# Patient Record
Sex: Male | Born: 1965 | Race: Black or African American | Hispanic: No | Marital: Single | Smoking: Current every day smoker
Health system: Southern US, Community
[De-identification: ages and names within clinical notes are randomized; demographics above are authoritative.]

## PROBLEM LIST (undated history)

## (undated) DIAGNOSIS — I10 Essential (primary) hypertension: Secondary | ICD-10-CM

---

## 2008-09-19 ENCOUNTER — Emergency Department (HOSPITAL_COMMUNITY): Admission: EM | Admit: 2008-09-19 | Discharge: 2008-09-20 | Payer: Self-pay | Admitting: Emergency Medicine

## 2008-09-20 ENCOUNTER — Emergency Department (HOSPITAL_COMMUNITY): Admission: EM | Admit: 2008-09-20 | Discharge: 2008-09-20 | Payer: Self-pay | Admitting: Emergency Medicine

## 2009-01-17 ENCOUNTER — Emergency Department (HOSPITAL_COMMUNITY): Admission: EM | Admit: 2009-01-17 | Discharge: 2009-01-17 | Payer: Self-pay | Admitting: Emergency Medicine

## 2009-03-03 ENCOUNTER — Emergency Department (HOSPITAL_COMMUNITY): Admission: EM | Admit: 2009-03-03 | Discharge: 2009-03-03 | Payer: Self-pay | Admitting: Emergency Medicine

## 2009-04-03 ENCOUNTER — Emergency Department (HOSPITAL_COMMUNITY): Admission: EM | Admit: 2009-04-03 | Discharge: 2009-04-03 | Payer: Self-pay | Admitting: Emergency Medicine

## 2009-06-15 ENCOUNTER — Emergency Department (HOSPITAL_COMMUNITY): Admission: EM | Admit: 2009-06-15 | Discharge: 2009-06-15 | Payer: Self-pay | Admitting: Emergency Medicine

## 2009-07-10 ENCOUNTER — Emergency Department (HOSPITAL_COMMUNITY): Admission: EM | Admit: 2009-07-10 | Discharge: 2009-07-10 | Payer: Self-pay | Admitting: Emergency Medicine

## 2010-07-12 LAB — CBC
MCHC: 34.5 g/dL (ref 30.0–36.0)
RBC: 5.25 MIL/uL (ref 4.22–5.81)
WBC: 6.2 10*3/uL (ref 4.0–10.5)

## 2010-07-12 LAB — RAPID URINE DRUG SCREEN, HOSP PERFORMED
Cocaine: POSITIVE — AB
Opiates: NOT DETECTED
Tetrahydrocannabinol: NOT DETECTED

## 2010-07-12 LAB — DIFFERENTIAL
Basophils Relative: 0 % (ref 0–1)
Lymphocytes Relative: 26 % (ref 12–46)
Monocytes Relative: 4 % (ref 3–12)
Neutro Abs: 4.2 10*3/uL (ref 1.7–7.7)
Neutrophils Relative %: 67 % (ref 43–77)

## 2010-07-12 LAB — ETHANOL: Alcohol, Ethyl (B): 11 mg/dL — ABNORMAL HIGH (ref 0–10)

## 2010-07-12 LAB — BASIC METABOLIC PANEL
CO2: 25 mEq/L (ref 19–32)
Calcium: 8.9 mg/dL (ref 8.4–10.5)
Creatinine, Ser: 0.87 mg/dL (ref 0.4–1.5)
GFR calc Af Amer: >60 mL/min (ref >60)
GFR calc non Af Amer: >60 mL/min (ref >60)

## 2010-07-17 LAB — URINALYSIS, ROUTINE W REFLEX MICROSCOPIC
Bilirubin Urine: NEGATIVE
Glucose, UA: NEGATIVE mg/dL
Ketones, ur: NEGATIVE mg/dL
Nitrite: NEGATIVE
pH: 6 (ref 5.0–8.0)

## 2010-08-01 LAB — URINALYSIS, ROUTINE W REFLEX MICROSCOPIC
Bilirubin Urine: NEGATIVE
Nitrite: NEGATIVE
Specific Gravity, Urine: 1.027 (ref 1.005–1.030)
Urobilinogen, UA: 0.2 mg/dL (ref 0.0–1.0)

## 2010-08-01 LAB — POCT I-STAT, CHEM 8
Chloride: 101 mEq/L (ref 96–112)
HCT: 54 % — ABNORMAL HIGH (ref 39.0–52.0)
Potassium: 3.2 mEq/L — ABNORMAL LOW (ref 3.5–5.1)
Sodium: 139 mEq/L (ref 135–145)

## 2010-08-01 LAB — PROTIME-INR
INR: 1 (ref 0.00–1.49)
Prothrombin Time: 13.1 seconds (ref 11.6–15.2)

## 2010-08-01 LAB — TYPE AND SCREEN

## 2010-08-01 LAB — CBC
Hemoglobin: 17.1 g/dL — ABNORMAL HIGH (ref 13.0–17.0)
RBC: 5.81 MIL/uL (ref 4.22–5.81)

## 2010-08-01 LAB — ABO/RH: ABO/RH(D): B NEG

## 2010-11-14 IMAGING — CR DG FOOT 2V*L*
2 series · 2 of 2 positions shown · non-contrast
Comparison: None

CLINICAL DATA: Left foot pain

LEFT FOOT - 2 VIEW

[view not recorded (1 of 2)]
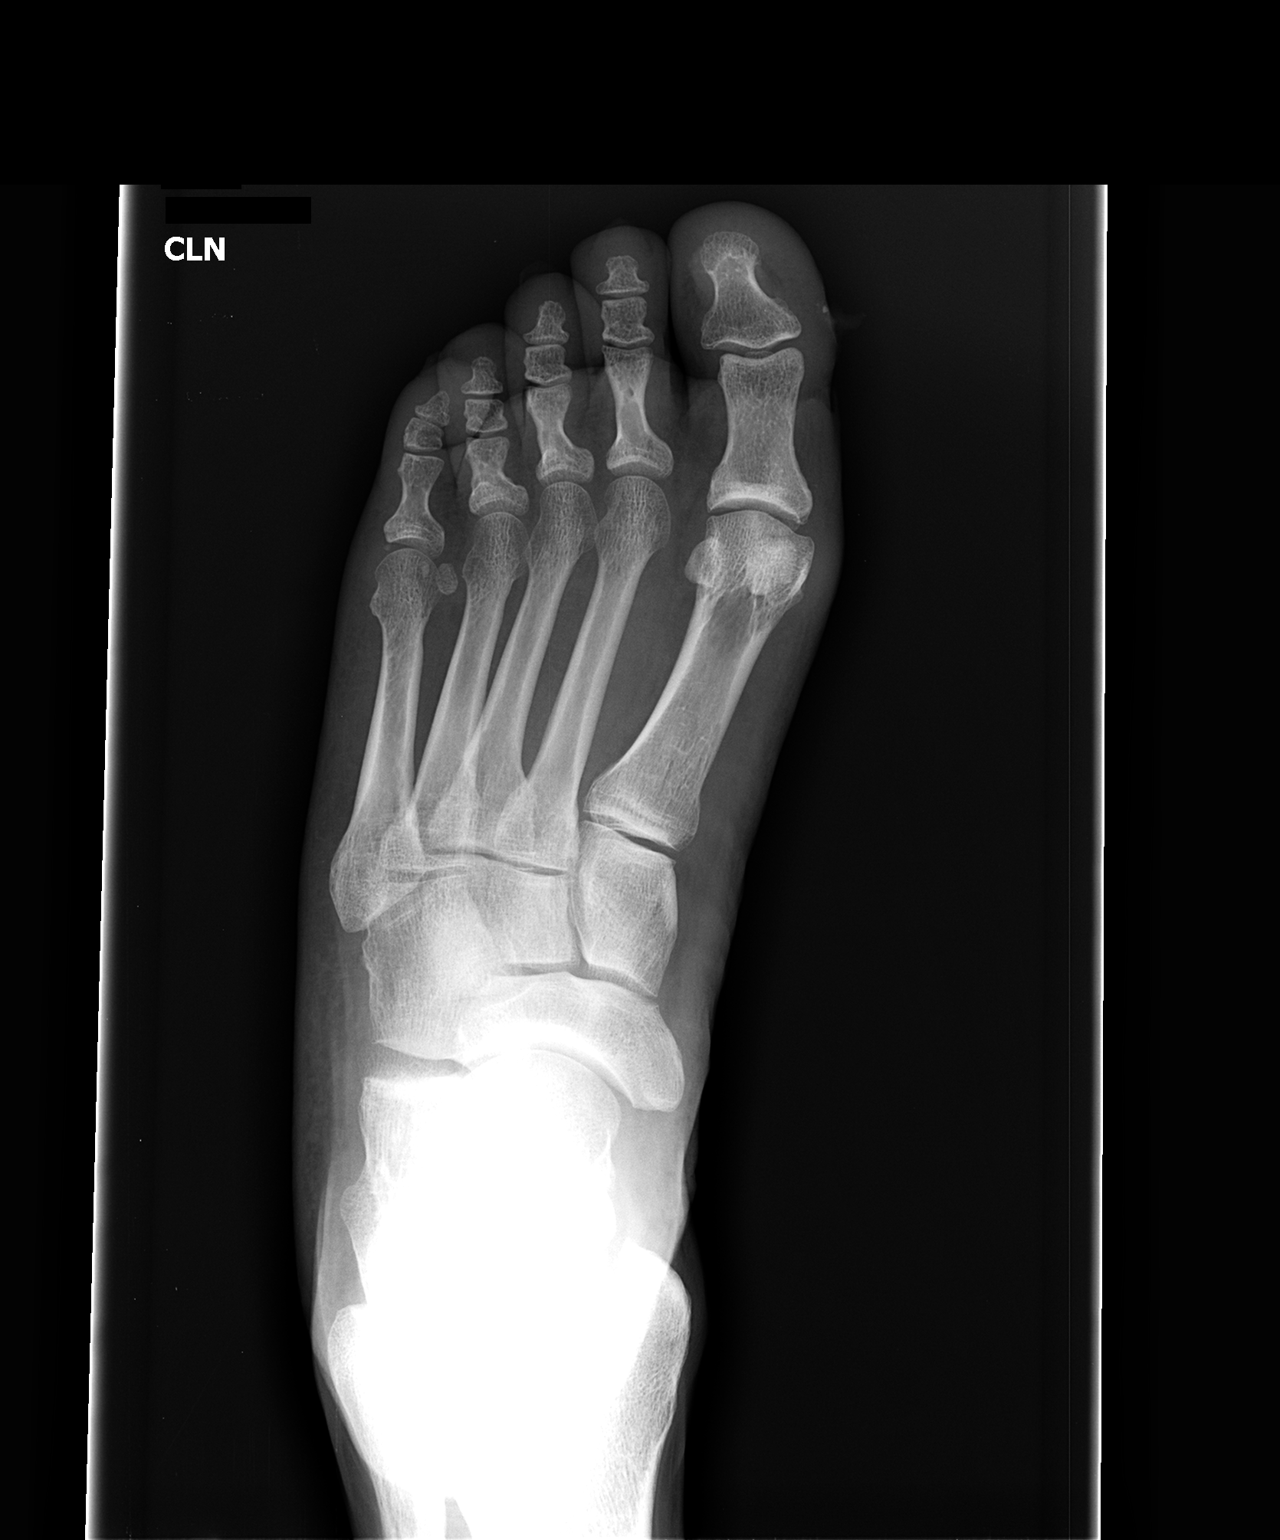

[view not recorded (2 of 2)]
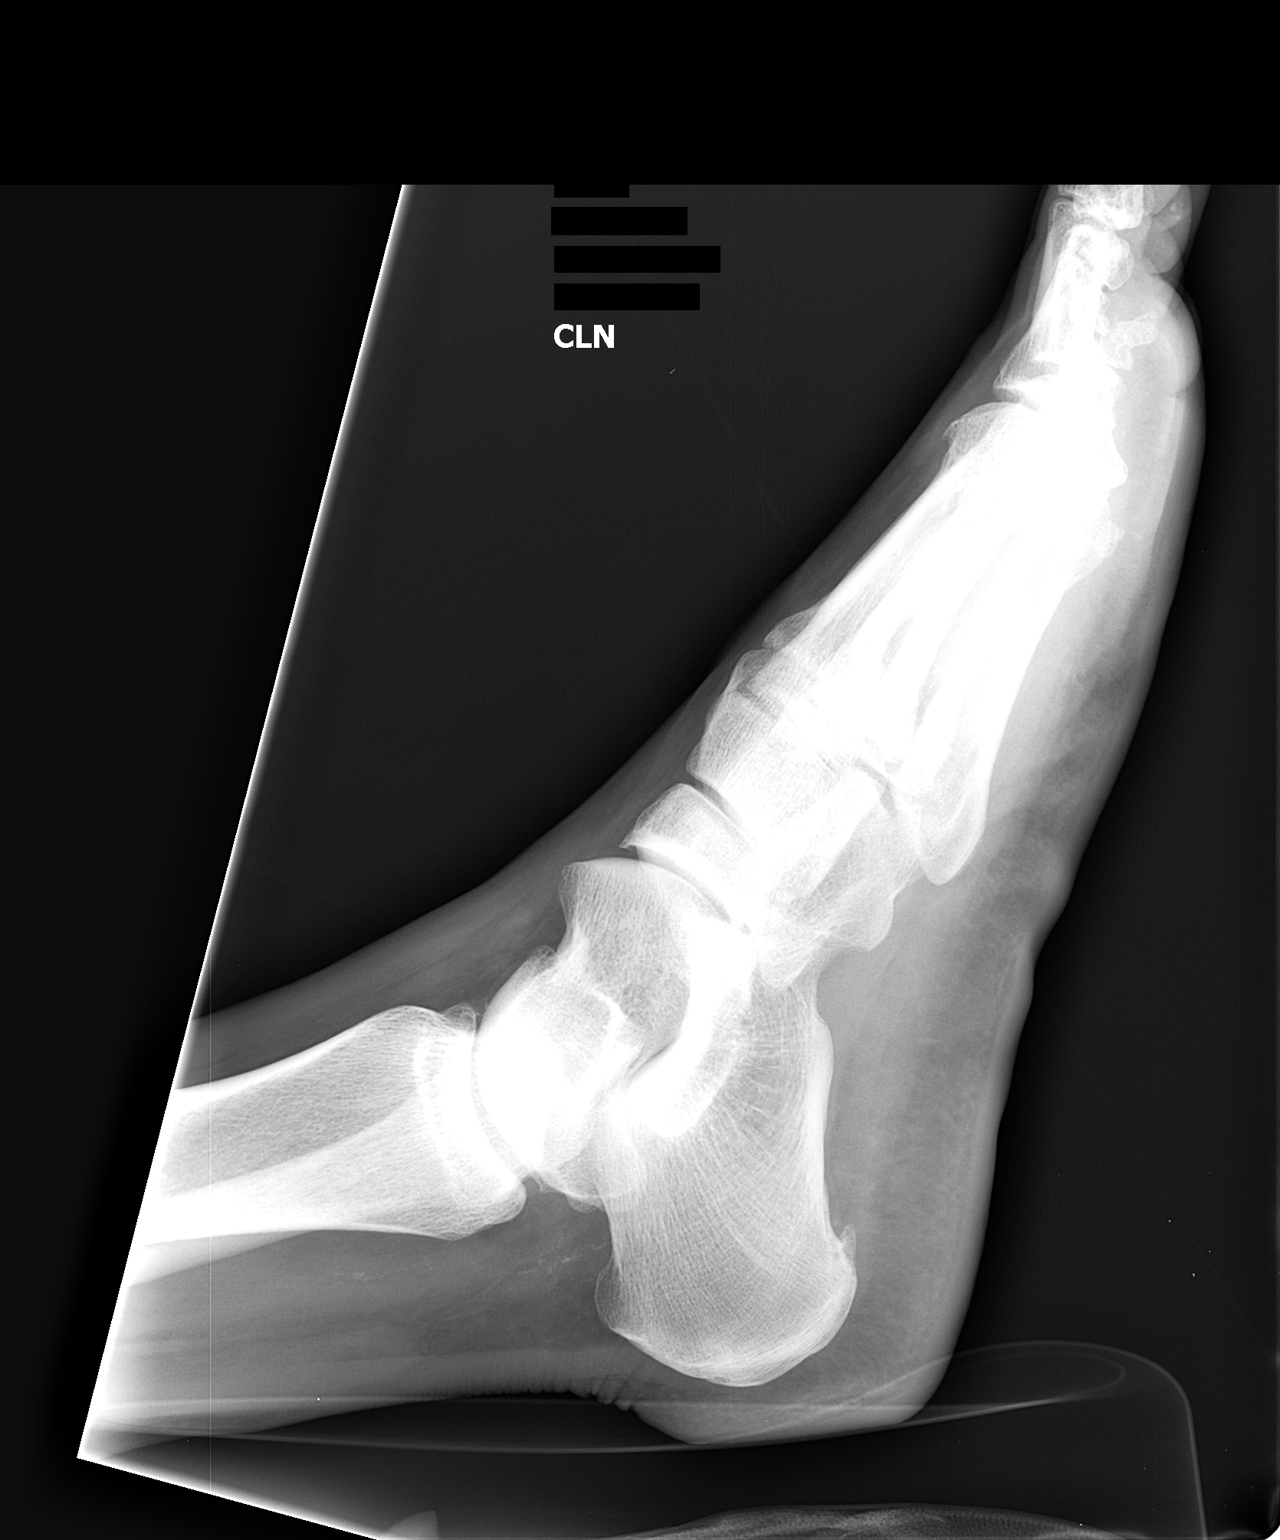

[2 of 2 positions shown; findings below may reference images not displayed]

FINDINGS: Degenerative changes of the first metatarsal phalangeal
articulation.  No fracture.  Soft tissues unremarkable.
IMPRESSION: Negative for fracture

## 2011-10-07 ENCOUNTER — Emergency Department: Payer: Self-pay | Admitting: Emergency Medicine

## 2011-10-12 ENCOUNTER — Emergency Department: Payer: Self-pay | Admitting: Emergency Medicine

## 2011-10-12 LAB — CBC
HGB: 16.1 g/dL (ref 13.0–18.0)
MCH: 29.7 pg (ref 26.0–34.0)
MCV: 88 fL (ref 80–100)
Platelet: 214 10*3/uL (ref 150–440)

## 2011-10-12 LAB — BASIC METABOLIC PANEL
Anion Gap: 7 (ref 7–16)
Calcium, Total: 8.7 mg/dL (ref 8.5–10.1)
Co2: 28 mmol/L (ref 21–32)
EGFR (African American): >60
EGFR (Non-African Amer.): 56 — ABNORMAL LOW
Glucose: 96 mg/dL (ref 65–99)
Sodium: 142 mmol/L (ref 136–145)

## 2011-11-04 ENCOUNTER — Emergency Department: Payer: Self-pay | Admitting: Emergency Medicine

## 2011-11-12 ENCOUNTER — Emergency Department: Payer: Self-pay | Admitting: Emergency Medicine

## 2011-11-12 LAB — CBC
HCT: 48.9 % (ref 40.0–52.0)
MCHC: 33.9 g/dL (ref 32.0–36.0)
MCV: 86 fL (ref 80–100)
Platelet: 233 10*3/uL (ref 150–440)
RBC: 5.66 10*6/uL (ref 4.40–5.90)
RDW: 14.2 % (ref 11.5–14.5)
WBC: 7.7 10*3/uL (ref 3.8–10.6)

## 2011-11-12 LAB — COMPREHENSIVE METABOLIC PANEL
Albumin: 3.5 g/dL (ref 3.4–5.0)
Anion Gap: 12 (ref 7–16)
BUN: 8 mg/dL (ref 7–18)
Bilirubin,Total: 0.8 mg/dL (ref 0.2–1.0)
Chloride: 102 mmol/L (ref 98–107)
Co2: 28 mmol/L (ref 21–32)
Creatinine: 0.98 mg/dL (ref 0.60–1.30)
EGFR (African American): >60
Glucose: 118 mg/dL — ABNORMAL HIGH (ref 65–99)
Osmolality: 283 (ref 275–301)
Potassium: 3.3 mmol/L — ABNORMAL LOW (ref 3.5–5.1)
SGOT(AST): 18 U/L (ref 15–37)
Sodium: 142 mmol/L (ref 136–145)
Total Protein: 6.9 g/dL (ref 6.4–8.2)

## 2011-11-12 LAB — URINALYSIS, COMPLETE
Bilirubin,UR: NEGATIVE
Glucose,UR: NEGATIVE mg/dL (ref 0–75)
Ketone: NEGATIVE
Leukocyte Esterase: NEGATIVE
Ph: 6 (ref 4.5–8.0)
RBC,UR: <1 /HPF (ref 0–5)
Specific Gravity: 1.015 (ref 1.003–1.030)
Squamous Epithelial: 1
WBC UR: 1 /HPF (ref 0–5)

## 2011-11-14 ENCOUNTER — Emergency Department: Payer: Self-pay | Admitting: Emergency Medicine

## 2011-11-14 LAB — URINALYSIS, COMPLETE
Bacteria: NONE SEEN
Bilirubin,UR: NEGATIVE
Blood: NEGATIVE
Nitrite: NEGATIVE
Ph: 7 (ref 4.5–8.0)
Protein: 30
RBC,UR: 1 /HPF (ref 0–5)
Specific Gravity: 1.028 (ref 1.003–1.030)
Squamous Epithelial: NONE SEEN

## 2011-11-14 LAB — CBC
HCT: 49 % (ref 40.0–52.0)
HGB: 16.2 g/dL (ref 13.0–18.0)
MCH: 29.1 pg (ref 26.0–34.0)
MCHC: 33.1 g/dL (ref 32.0–36.0)
MCV: 88 fL (ref 80–100)

## 2011-11-14 LAB — COMPREHENSIVE METABOLIC PANEL
Albumin: 3.8 g/dL (ref 3.4–5.0)
Alkaline Phosphatase: 106 U/L (ref 50–136)
BUN: 9 mg/dL (ref 7–18)
Bilirubin,Total: 0.6 mg/dL (ref 0.2–1.0)
Calcium, Total: 9.1 mg/dL (ref 8.5–10.1)
Osmolality: 280 (ref 275–301)
SGOT(AST): 28 U/L (ref 15–37)

## 2011-11-14 LAB — TROPONIN I: Troponin-I: 0.02 ng/mL

## 2011-11-14 LAB — LIPASE, BLOOD: Lipase: 109 U/L (ref 73–393)

## 2014-08-08 ENCOUNTER — Emergency Department (HOSPITAL_COMMUNITY): Payer: Self-pay

## 2014-08-08 ENCOUNTER — Emergency Department (HOSPITAL_COMMUNITY)
Admission: EM | Admit: 2014-08-08 | Discharge: 2014-08-09 | Disposition: A | Discharge status: Home / Self Care | Payer: Self-pay | Attending: Emergency Medicine | Admitting: Emergency Medicine

## 2014-08-08 ENCOUNTER — Encounter (HOSPITAL_COMMUNITY): Payer: Self-pay | Admitting: Emergency Medicine

## 2014-08-08 DIAGNOSIS — Z720 Tobacco use: Secondary | ICD-10-CM | POA: Insufficient documentation

## 2014-08-08 DIAGNOSIS — F1010 Alcohol abuse, uncomplicated: Secondary | ICD-10-CM | POA: Insufficient documentation

## 2014-08-08 DIAGNOSIS — M1711 Unilateral primary osteoarthritis, right knee: Secondary | ICD-10-CM | POA: Insufficient documentation

## 2014-08-08 DIAGNOSIS — M25461 Effusion, right knee: Secondary | ICD-10-CM | POA: Insufficient documentation

## 2014-08-08 DIAGNOSIS — M25571 Pain in right ankle and joints of right foot: Secondary | ICD-10-CM | POA: Insufficient documentation

## 2014-08-08 MED ORDER — CHLORDIAZEPOXIDE HCL 25 MG PO CAPS: ORAL_CAPSULE | ORAL | Status: DC

## 2014-08-08 MED ORDER — NAPROXEN 500 MG PO TABS: 500.0000 mg | ORAL_TABLET | Freq: Two times a day (BID) | ORAL | Status: DC

## 2014-08-08 MED ORDER — NAPROXEN 250 MG PO TABS
500.0000 mg | ORAL_TABLET | Freq: Once | ORAL | Status: AC
  Administered 2014-08-08: 500 mg via ORAL
  Filled 2014-08-08: qty 2

## 2014-08-08 MED ORDER — CHLORDIAZEPOXIDE HCL 5 MG PO CAPS
10.0000 mg | ORAL_CAPSULE | Freq: Once | ORAL | Status: AC
  Administered 2014-08-08: 10 mg via ORAL
  Filled 2014-08-08: qty 2

## 2014-08-08 NOTE — ED Provider Notes (Signed)
CSN: [id number]     Arrival date & time 08/08/14  2045 History   First MD Initiated Contact with Patient 08/08/14 2108     Chief Complaint  Patient presents with  . Leg Pain  . Alcohol Problem    HPI Patient presents to the emergency room with complaints of pain in his right knee and right ankle for the last 2 days. Have a history of a prior injuries knee but that was many years ago. Has not had any recent falls or injuries but has noticed some swelling and discomfort when he walks in both the ankle and the knee. Patient denies any fevers or chills. No chest pain or shortness of breath.  Patient also states that he is an alcoholic and abuses cocaine. He drinks a large amount every day. He was not able to quantify. He decided that he wanted to stop drinking. He last drank any alcohol this morning. He denies any prior history of having any withdrawal symptoms. No C History reviewed. No pertinent past medical history. History reviewed. No pertinent past surgical history. No family history on file. History  Substance Use Topics  . Smoking status: Current Every Day Smoker  . Smokeless tobacco: Not on file  . Alcohol Use: Yes    Review of Systems  All other systems reviewed and are negative.     Allergies  Review of patient's allergies indicates not on file.  Home Medications   Prior to Admission medications   Medication Sig Start Date End Date Taking? Authorizing Provider  chlordiazePOXIDE (LIBRIUM) 25 MG capsule  PO TID x 1D, then 25-50mg  PO BID X 1D, then 25-50mg  PO QD X 1D 08/08/14   Linwood Dibbles, MD  naproxen (NAPROSYN) 500 MG tablet Take 1 tablet (500 mg total) by mouth 2 (two) times daily with a meal. As needed for pain 08/08/14   Linwood Dibbles, MD   BP 137/97 mmHg  Pulse 84  Temp(Src) 98.5 F (36.9 C) (Oral)  Resp 18  SpO2 98% Physical Exam  Constitutional: He appears well-developed and well-nourished. No distress.  HENT:  Head: Normocephalic and atraumatic.  Right Ear:  External ear normal.  Left Ear: External ear normal.  Eyes: Conjunctivae are normal. Right eye exhibits no discharge. Left eye exhibits no discharge. No scleral icterus.  Neck: Neck supple. No tracheal deviation present.  Cardiovascular: Normal rate.   Pulmonary/Chest: Effort normal. No stridor. No respiratory distress.  Musculoskeletal: He exhibits tenderness. He exhibits no edema.       Right knee: He exhibits effusion. He exhibits normal range of motion, no deformity, no laceration and no erythema. No tenderness found.       Right ankle: He exhibits no swelling, no deformity and no laceration. Tenderness.  Neurological: He is alert. Cranial nerve deficit: no gross deficits.  Skin: Skin is warm and dry. No rash noted.  Psychiatric: He has a normal mood and affect.  Nursing note and vitals reviewed.   ED Course  Procedures (including critical care time) Labs Review Labs Reviewed - No data to display  Imaging Review Dg Ankle Complete Right  08/08/2014   CLINICAL DATA:  Right ankle pain  EXAM: RIGHT ANKLE - COMPLETE 3+ VIEW  COMPARISON:  None.  FINDINGS: Ankle mortise intact. No displaced fracture or dislocation. No aggressive osseous lesion. Plantar calcaneal enthesophyte. Atherosclerotic vascular calcifications  IMPRESSION: No acute or aggressive osseous finding of the right ankle.  Plantar calcaneal enthesophyte.   Electronically Signed   By: Jearld Lesch  M.D.   On: 08/08/2014 22:10   Dg Knee Complete 4 Views Right  08/08/2014   CLINICAL DATA:  Right knee pain, soft-tissue swelling on medial side with no known injury  EXAM: RIGHT KNEE - COMPLETE 4+ VIEW  COMPARISON:  None.  FINDINGS: Moderately severe narrowing and severe osteophyte formation of the medial and lateral compartments. No fracture or dislocation. There are loose bodies in the joint. There is severe osteophyte formation in the patellofemoral compartment. There is a large joint effusion.  IMPRESSION: Severe arthritic change  with large joint effusion.   Electronically Signed   By: Esperanza Heiraymond  Rubner M.D.   On: 08/08/2014 22:07      MDM   Final diagnoses:  Osteoarthritis of right knee, unspecified osteoarthritis type  Alcohol abuse    No sign of infection.  Severe oa has caused a joint effusion.  Pt has full rom.  No indication for arthrocentesis at this time.  Rest, crutches, knee sleeve.  Folllow up with ortho or pcp  Pt is an alcoholic.  No signs of withdrawal.  Will provide outpatient resources.  Rx librium to help with withdrawal sx developing    Linwood DibblesJon Tawana Pasch, MD 08/08/14 2250

## 2014-08-08 NOTE — ED Notes (Signed)
MD at bedside. 

## 2014-08-08 NOTE — ED Notes (Signed)
Doctor at bedside.

## 2014-08-08 NOTE — ED Notes (Addendum)
C/o R lower leg pain and swelling x 2 days.  States pain starts at knee and radiates down leg.  Denies injury.  Pt also requesting resources for etoh and cocaine detox.

## 2014-08-08 NOTE — Discharge Instructions (Signed)
Alcohol Withdrawal Alcohol withdrawal happens when you normally drink alcohol a lot and suddenly stop drinking. Alcohol withdrawal symptoms can be mild to very bad. Mild withdrawal symptoms can include feeling sick to your stomach (nauseous), headache, or feeling irritable. Bad withdrawal symptoms can include shakiness, being very nervous (anxious), and not thinking clearly.  HOME CARE  Join an alcohol support group.  Stay away from people or situations that make you want to drink.  Eat a healthy diet. Eat a lot of fresh fruits, vegetables, and lean meats. GET HELP RIGHT AWAY IF:   You become confused. You start to see and hear things that are not really there.  You feel your heart beating very fast.  You throw up (vomit) blood or cannot stop throwing up. This may be bright red or look like black coffee grounds.  You have blood in your poop (stool). This may be bright red, maroon colored, or black and tarry.  You are lightheaded or pass out (faint).  You develop a fever. MAKE SURE YOU:   Understand these instructions.  Will watch your condition. Will get help right away if you are not doing well or get worse.Substance Abuse Treatment Programs  Intensive Outpatient Programs [facility]     [address]      [place], [place]                   [phone]       [facility] [address] [place], [place] [phone]  [facility]     (Inpatient and outpatient)     [address]           [phone]    [facility] [phone] (Suboxone and Methadone)  [address]      [place], [place] [address]      [phone]       [address] [place], [place] [phone]  [facility] (Outpatient/Inpatient, Chemical)    (insurance only) [phone]             [facility] (Groups & Residential) [place], [place] [phone]     [facility]     [address]     [place], [place]      [phone]       [facility] (for caregivers and family) [phone] [place], [place] [phone]      Residential Treatment Programs [facility]      [address], [place], [place] [address]  [phone]       [facility] [address] [place], [place] [address] [phone]  [facility]        [phone]       [facility] [phone]  [facility]             [address]                                         [place], [place]                                                [phone] or [phone]  [facility] [address] [place], [address] [phone]  [facility]    [address]      [place], [place]     [phone]       [facility] [address] [place], [place] [phone]  [facility]   [address] [place], [place] [address]     [phone]      Admissions: 8am-3pm M-F  [facility] ([facility]) [address] [place], [place] [phone]  [facility]: Residential Program [phone] Days)   [place], [place]      [phone] or [phone]     [facility]: [facility] [place], [place] (Walk in Hours over the weekend or by referral)  [facility] [address] [place], [place], [place] [address] [phone]  Crisis Mobile: [facility]:  [phone] (for crisis response 24 hours a day) [facility] Hotline:      9841680271 Outpatient Psychiatry and Counseling  [facility]: Mobile Crisis Management 24 hours:  [phone]  [facility] [employer] sliding scale fee and walk in schedule: M-F 8am-12pm/1pm-3pm [address]  [place], [place] [address] [phone]  [facility] [address] [place], [place]  [address] [phone]  [facility] ([facility] [employer])- new patient walk-in appointments available Monday - Friday 8am -3pm.          [address] [place], [place] [address] [phone] or crisis line- [phone]  [facility] Out[facility] Services/ Intensive Outpatient Therapy Program [address] [place], [place] [address] [phone]  [facility]                  Crisis Services      [phone] [address]     [place], [place] [address]                 [facility]   [facility] [phone]. [address] [place], [place] [address]   [facility] of Care          [address]  [place], [place] [address]       [phone]  [facility] [address] [place], [place] [address] [phone]  [facility]    [address]    [place], [place] [address]     [phone]       Andee Poles, MD     [address]     [place] [address]     [phone]       [facility] [address] [place] [address] [place] [address]  [facility]     [address] [place], [place]      [phone]       [facility] Eulogio Ditch, MD [address] [place], [place] [address] [phone]  [facility]     [address]     [place], [place] [address]     [phone]       [facility] [address] [place], [place] [address] [phone] Resources for Temporary Residential Assistance/Crisis Centers  [facility]) M-F 8am-3pm   [address] [place], [place] [address]   [phone] Services include: laundry, barbering, support groups, case management, phone  &  computer access, showers, AA/NA mtgs, mental health/substance abuse nurse, job skills class,  disability information, VA assistance, spiritual classes, etc.   HOMELESS SHELTERS  [facility]     [facility]   [address] [place]     [phone]              [address] (women and children)       [address] [place], [place] [address] [phone] [email or web address] for application and process Application Required  [facility]   [address]    [place] [address]     [phone]                    [facility] [address] [place], [place] [address] [phone] application appt.) Application Required  [facility] (women only)    [address]     [place], [place] [address]     [phone]      Intake starts 6pm daily Need valid ID, SSC, & Police report [employer] [address] [place], [place] [phone] Application Required  [employer] (men only)     [address]      [place], [place]     [phone]       [facility] [place] (Pregnant women only) [address] [place], [place] [phone]  [facility]      [address]      [place], [place] [address]     [phone]             [facility] [address] [place], [place] [phone] 90 day commitment/SA/Application process  [facility](men only)     [address]     [place], [place]     [phone]       Check-in at [facility] [address] [place], [place] [address] [phone] Men/Women/Women and Children must be there by 7 pm  [facility] [place], [place] [phone]                Arthritis, Nonspecific Arthritis is inflammation of a joint. This usually means pain, redness, warmth or swelling are present. One or more joints may be involved. There are a number of types of arthritis. Your caregiver may not be able to  tell what type of arthritis you have right away. CAUSES  The most common cause of arthritis is the wear and tear on the joint (osteoarthritis). This causes damage to the cartilage, which can break down over time. The knees, hips, back and neck are most often affected by this type of arthritis. Other types of arthritis and common causes of joint pain include:  Sprains and other injuries near the joint. Sometimes minor sprains and injuries cause pain and swelling that develop hours later.  Rheumatoid arthritis. This affects hands, feet and knees. It usually affects both sides of your body at the same time. It is often associated with chronic ailments, fever, weight loss and general weakness.  Crystal arthritis. Gout and pseudo gout can cause occasional acute severe pain, redness and swelling in the foot, ankle, or knee.  Infectious arthritis. Bacteria can get into a joint through a break in overlying skin. This can cause infection of the joint. Bacteria and viruses can  also spread through the blood and affect your joints.  Drug, infectious and allergy reactions. Sometimes joints can become mildly painful and slightly swollen with these types of illnesses. SYMPTOMS   Pain is the main symptom.  Your joint or joints can also be red, swollen and warm or hot to the touch.  You may have a fever with certain types of arthritis, or even feel overall ill.  The joint with arthritis will hurt with movement. Stiffness is present with some types of arthritis. DIAGNOSIS  Your caregiver will suspect arthritis based on your description of your symptoms and on your exam. Testing may be needed to find the type of arthritis:  Blood and sometimes urine tests.  X-ray tests and sometimes CT or MRI scans.  Removal of fluid from the joint (arthrocentesis) is done to check for bacteria, crystals or other causes. Your caregiver (or a specialist) will numb the area over the joint with a local anesthetic, and use a  needle to remove joint fluid for examination. This procedure is only minimally uncomfortable.  Even with these tests, your caregiver may not be able to tell what kind of arthritis you have. Consultation with a specialist (rheumatologist) may be helpful. TREATMENT  Your caregiver will discuss with you treatment specific to your type of arthritis. If the specific type cannot be determined, then the following general recommendations may apply. Treatment of severe joint pain includes:  Rest.  Elevation.  Anti-inflammatory medication (for example, ibuprofen) may be prescribed. Avoiding activities that cause increased pain.  Only take over-the-counter or prescription medicines for pain and discomfort as recommended by your caregiver.  Cold packs over an inflamed joint may be used for 10 to 15 minutes every hour. Hot packs sometimes feel better, but do not use overnight. Do not use hot packs if you are diabetic without your caregiver's permission.  A cortisone shot into arthritic joints may help reduce pain and swelling.  Any acute arthritis that gets worse over the next 1 to 2 days needs to be looked at to be sure there is no joint infection. Long-term arthritis treatment involves modifying activities and lifestyle to reduce joint stress jarring. This can include weight loss. Also, exercise is needed to nourish the joint cartilage and remove waste. This helps keep the muscles around the joint strong. HOME CARE INSTRUCTIONS   Do not take aspirin to relieve pain if gout is suspected. This elevates uric acid levels.  Only take over-the-counter or prescription medicines for pain, discomfort or fever as directed by your caregiver.  Rest the joint as much as possible.  If your joint is swollen, keep it elevated.  Use crutches if the painful joint is in your leg.  Drinking plenty of fluids may help for certain types of arthritis.  Follow your caregiver's dietary instructions.  Try low-impact  exercise such as:  Swimming.  Water aerobics.  Biking.  Walking.  Morning stiffness is often relieved by a warm shower.  Put your joints through regular range-of-motion. SEEK MEDICAL CARE IF:   You do not feel better in 24 hours or are getting worse.  You have side effects to medications, or are not getting better with treatment. SEEK IMMEDIATE MEDICAL CARE IF:   You have a fever.  You develop severe joint pain, swelling or redness.  Many joints are involved and become painful and swollen.  There is severe back pain and/or leg weakness.  You have loss of bowel or bladder control. Document Released: 05/17/2004 Document Revised: 07/02/2011  Document Reviewed: 06/02/2008 [facility] Patient Information 2015 [place], [place]. This information is not intended to replace advice given to you by your health care provider. Make sure you discuss any questions you have with your health care provider.   Document Released: 09/26/2007 Document Revised: 07/02/2011 Document Reviewed: 09/26/2007 [facility] Patient Information 2015 [place], [place]. This information is not intended to replace advice given to you by your health care provider. Make sure you discuss any questions you have with your health care provider.

## 2014-08-08 NOTE — ED Notes (Signed)
Pt states already [facility] detox [facility] but looking for a ride to get there

## 2014-08-08 NOTE — ED Notes (Signed)
Patient transported to X-ray 

## 2014-08-09 ENCOUNTER — Emergency Department (HOSPITAL_COMMUNITY)
Admission: EM | Admit: 2014-08-09 | Discharge: 2014-08-11 | Disposition: A | Discharge status: Home / Self Care | Payer: Self-pay | Attending: Emergency Medicine | Admitting: Emergency Medicine

## 2014-08-09 ENCOUNTER — Encounter (HOSPITAL_COMMUNITY): Payer: Self-pay | Admitting: Emergency Medicine

## 2014-08-09 DIAGNOSIS — M25561 Pain in right knee: Secondary | ICD-10-CM | POA: Insufficient documentation

## 2014-08-09 DIAGNOSIS — R45851 Suicidal ideations: Secondary | ICD-10-CM | POA: Insufficient documentation

## 2014-08-09 DIAGNOSIS — I10 Essential (primary) hypertension: Secondary | ICD-10-CM | POA: Insufficient documentation

## 2014-08-09 DIAGNOSIS — Z791 Long term (current) use of non-steroidal anti-inflammatories (NSAID): Secondary | ICD-10-CM | POA: Insufficient documentation

## 2014-08-09 DIAGNOSIS — Z720 Tobacco use: Secondary | ICD-10-CM | POA: Insufficient documentation

## 2014-08-09 DIAGNOSIS — F333 Major depressive disorder, recurrent, severe with psychotic symptoms: Secondary | ICD-10-CM | POA: Diagnosis present

## 2014-08-09 HISTORY — DX: Essential (primary) hypertension: I10

## 2014-08-09 LAB — RAPID URINE DRUG SCREEN, HOSP PERFORMED
Amphetamines: NOT DETECTED
BARBITURATES: NOT DETECTED
Benzodiazepines: POSITIVE — AB
COCAINE: POSITIVE — AB
Opiates: NOT DETECTED
TETRAHYDROCANNABINOL: NOT DETECTED

## 2014-08-09 LAB — COMPREHENSIVE METABOLIC PANEL
ALBUMIN: 3.6 g/dL (ref 3.5–5.2)
ALK PHOS: 81 U/L (ref 39–117)
ALT: 29 U/L (ref 0–53)
ANION GAP: 6 (ref 5–15)
AST: 26 U/L (ref 0–37)
BUN: 14 mg/dL (ref 6–23)
CO2: 27 mmol/L (ref 19–32)
Calcium: 8.6 mg/dL (ref 8.4–10.5)
Chloride: 105 mmol/L (ref 96–112)
Creatinine, Ser: 1.03 mg/dL (ref 0.50–1.35)
GFR calc Af Amer: >90 mL/min (ref >90)
GFR calc non Af Amer: 83 mL/min — ABNORMAL LOW (ref >90)
Glucose, Bld: 92 mg/dL (ref 70–99)
POTASSIUM: 3.9 mmol/L (ref 3.5–5.1)
SODIUM: 138 mmol/L (ref 135–145)
TOTAL PROTEIN: 6.3 g/dL (ref 6.0–8.3)
Total Bilirubin: 0.6 mg/dL (ref 0.3–1.2)

## 2014-08-09 LAB — CBC
HCT: 46.5 % (ref 39.0–52.0)
Hemoglobin: 16 g/dL (ref 13.0–17.0)
MCH: 29.9 pg (ref 26.0–34.0)
MCHC: 34.4 g/dL (ref 30.0–36.0)
MCV: 86.9 fL (ref 78.0–100.0)
PLATELETS: 251 10*3/uL (ref 150–400)
RBC: 5.35 MIL/uL (ref 4.22–5.81)
RDW: 13.9 % (ref 11.5–15.5)
WBC: 5.6 10*3/uL (ref 4.0–10.5)

## 2014-08-09 LAB — SALICYLATE LEVEL

## 2014-08-09 LAB — ETHANOL

## 2014-08-09 LAB — ACETAMINOPHEN LEVEL

## 2014-08-09 NOTE — ED Provider Notes (Signed)
CSN: [id number]     Arrival date & time 08/09/14  1213 History  This chart was scribed for Robert Horsemanobert Rechel Delosreyes, PA-C, working with Robert Munchobert Lockwood, MD by Jolene Provostobert Halas, ED Scribe. This patient was seen in room [facility] and the patient's care was started at 1:30 PM.    Chief Complaint  Patient presents with  . Suicidal    The history is provided by the patient. No language interpreter was used.    HPI Comments: Robert Foster is a 49 y.o. male who presents to the Emergency Department complaining of SI with plan. Pt states he is thinking about jumping off a bridge. Pt states that he has a hx of SI with plan. Pt denies HI. Pt states he is depressed and that is why he is experiencing SI. Pt states he has been diagnosed with major depressive disorder. Pt states he drinks, and uses cocaine intermittently. Pt denies nausea, vomiting, fever, chills, SOB or chest pain.   Pt states he also has pain in his right knee, and was seen in the ED recently for those sx.    Past Medical History  Diagnosis Date  . Hypertension    History reviewed. No pertinent past surgical history. History reviewed. No pertinent family history. History  Substance Use Topics  . Smoking status: Current Every Day Smoker -- 0.50 packs/day    Types: Cigarettes  . Smokeless tobacco: Not on file  . Alcohol Use: Yes     Comment: 10- 1/5 bottles of wine    Review of Systems  Constitutional: Negative for fever and chills.  Musculoskeletal: Positive for joint swelling and arthralgias.       Right knee pain.  Psychiatric/Behavioral: Positive for suicidal ideas and self-injury.    Allergies  Review of patient's allergies indicates no known allergies.  Home Medications   Prior to Admission medications   Medication Sig Start Date End Date Taking? Authorizing Provider  chlordiazePOXIDE (LIBRIUM) 25 MG capsule 50mg  PO TID x 1D, then 25-50mg  PO BID X 1D, then 25-50mg  PO QD X 1D 08/08/14  Yes Linwood DibblesJon Knapp, MD  naproxen (NAPROSYN) 500  MG tablet Take 1 tablet (500 mg total) by mouth 2 (two) times daily with a meal. As needed for pain 08/08/14  Yes Linwood DibblesJon Knapp, MD   BP 128/84 mmHg  Pulse 61  Temp(Src) 97.8 F (36.6 C) (Oral)  Resp 16  SpO2 97% Physical Exam  Constitutional: He is oriented to person, place, and time. He appears well-developed and well-nourished. No distress.  HENT:  Head: Normocephalic and atraumatic.  Eyes: Conjunctivae and EOM are normal. Pupils are equal, round, and reactive to light. Right eye exhibits no discharge. Left eye exhibits no discharge. No scleral icterus.  Neck: Normal range of motion. Neck supple. No JVD present.  Cardiovascular: Normal rate, regular rhythm and normal heart sounds.  Exam reveals no gallop and no friction rub.   No murmur heard. Pulmonary/Chest: Effort normal and breath sounds normal. No respiratory distress. He has no wheezes. He has no rales. He exhibits no tenderness.  Abdominal: Soft. He exhibits no distension and no mass. There is no tenderness. There is no rebound and no guarding.  Musculoskeletal: Normal range of motion. He exhibits no edema or tenderness.  Neurological: He is alert and oriented to person, place, and time. Coordination normal.  Skin: Skin is warm and dry. He is not diaphoretic.  Psychiatric: He has a normal mood and affect. His behavior is normal. Judgment and thought content normal.  Nursing note  and vitals reviewed.   ED Course  Procedures  DIAGNOSTIC STUDIES: Oxygen Saturation is 97% on RA, normal by my interpretation.    COORDINATION OF CARE: 1:37 PM Discussed treatment plan with pt at bedside and pt agreed to plan.  Labs Review Labs Reviewed  CBC  ACETAMINOPHEN LEVEL  COMPREHENSIVE METABOLIC PANEL  ETHANOL  SALICYLATE LEVEL  URINE RAPID DRUG SCREEN (HOSP PERFORMED)    Imaging Review Dg Ankle Complete Right  08/08/2014   CLINICAL DATA:  Right ankle pain  EXAM: RIGHT ANKLE - COMPLETE 3+ VIEW  COMPARISON:  None.  FINDINGS: Ankle  mortise intact. No displaced fracture or dislocation. No aggressive osseous lesion. Plantar calcaneal enthesophyte. Atherosclerotic vascular calcifications  IMPRESSION: No acute or aggressive osseous finding of the right ankle.  Plantar calcaneal enthesophyte.   Electronically Signed   By: Robert Foster M.D.   On: 08/08/2014 22:10   Dg Knee Complete 4 Views Right  08/08/2014   CLINICAL DATA:  Right knee pain, soft-tissue swelling on medial side with no known injury  EXAM: RIGHT KNEE - COMPLETE 4+ VIEW  COMPARISON:  None.  FINDINGS: Moderately severe narrowing and severe osteophyte formation of the medial and lateral compartments. No fracture or dislocation. There are loose bodies in the joint. There is severe osteophyte formation in the patellofemoral compartment. There is a large joint effusion.  IMPRESSION: Severe arthritic change with large joint effusion.   Electronically Signed   By: Robert Foster M.D.   On: 08/08/2014 22:07     EKG Interpretation None      MDM   Final diagnoses:  None    Patient recently seen for alcohol detox, but now stating that he has suicidal thoughts. States that he wants to jump from an overpass into traffic. He states that he has tried to commit suicide in the past by ingesting pain medication and sleeping pills.  TTS consult pending.  I personally performed the services described in this documentation, which was scribed in my presence. The recorded information has been reviewed and is accurate.    Robert Horseman, PA-C 08/09/14 1913  Robert Munch, MD 08/10/14 [phone]
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
## 2014-08-09 NOTE — ED Notes (Signed)
Patient flat. Denies SI, HI, AVH at present. Reports passive SI and AH earlier in the day. States that his sleep and appetite have been poor. Also reports lack of resources to obtain food at times. Denies feeling anxious. Rates feelings of depression at 8/10.  Encouragement offered. Snack provided.  Q 15 safety checks continue.

## 2014-08-09 NOTE — ED Notes (Signed)
Bed: [facility]43 Expected date:  Expected time:  Means of arrival:  Comments: TR 3 

## 2014-08-09 NOTE — ED Notes (Signed)
Pt sought help for ETOH and cocaine detox today, told staff he had suicidal ideation with plan, pt sent to ED. Pt states he typically drinks 10 "1/5" bottles of wine per day, states he feels hopeless, hears voices telling him to hurt himself, and has suicidal ideation with plan to jump off of bridge into traffic. Pt denies visual hallucinations, denies thoughts of harming others.

## 2014-08-10 DIAGNOSIS — F333 Major depressive disorder, recurrent, severe with psychotic symptoms: Secondary | ICD-10-CM

## 2014-08-10 MED ORDER — IBUPROFEN 800 MG PO TABS
800.0000 mg | ORAL_TABLET | Freq: Three times a day (TID) | ORAL | Status: DC | PRN
  Administered 2014-08-10: 800 mg via ORAL
  Filled 2014-08-10: qty 1

## 2014-08-10 NOTE — ED Notes (Signed)
Patient appears calm, cooperative. Denies SI, HI, AVH. Rates anxiety 5/10, depression 8/10. C/O pain r/t arthritis in right knee and ankle.  Encouragement offered. Will contact EDP regarding pain.  Q 15 safety checks in place.

## 2014-08-10 NOTE — Progress Notes (Signed)
CM spoke [place]. CM discussed and provided written information for self pay pcps, importance of pcp for f/u care, www.needymeds.org, www.goodrx.com, discounted pharmacies and other [place] resources such as [facility], [facility], affordable care act,  financial assistance, DSS and  health department  Reviewed resources for [place] self pay pcps like [facility], family medicine at [address], [facility], general medical clinics, [facility] urgent [facility] plus others, medication resources, [facility] out patient pharmacies and housing Pt voiced understanding and appreciation of resources provided   Provided [facility] contact information [facility] by [facility] this am and given resources
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
## 2014-08-10 NOTE — BH Assessment (Signed)
[facility] Assessment Progress Note  The following facilities have been contacted in an effort to place this patient, with results as noted:  Beds available, information faxed, decision pending:  [facility]  No IPRS beds currently available:  [facility]  At capacity:  [facility]   Kyona Chauncey, MA Triage Specialist [phone]     

## 2014-08-11 ENCOUNTER — Inpatient Hospital Stay (HOSPITAL_COMMUNITY)
Admission: AD | Admit: 2014-08-11 | Discharge: 2014-08-17 | DRG: 885 | Disposition: A | Discharge status: Home / Self Care | Payer: Federal, State, Local not specified - Other | Source: Intra-hospital | Attending: Psychiatry | Admitting: Psychiatry

## 2014-08-11 ENCOUNTER — Encounter (HOSPITAL_COMMUNITY): Payer: Self-pay | Admitting: Behavioral Health

## 2014-08-11 DIAGNOSIS — F17210 Nicotine dependence, cigarettes, uncomplicated: Secondary | ICD-10-CM | POA: Diagnosis present

## 2014-08-11 DIAGNOSIS — Z9114 Patient's other noncompliance with medication regimen: Secondary | ICD-10-CM | POA: Diagnosis present

## 2014-08-11 DIAGNOSIS — F1410 Cocaine abuse, uncomplicated: Secondary | ICD-10-CM | POA: Diagnosis not present

## 2014-08-11 DIAGNOSIS — I10 Essential (primary) hypertension: Secondary | ICD-10-CM | POA: Diagnosis present

## 2014-08-11 DIAGNOSIS — F10230 Alcohol dependence with withdrawal, uncomplicated: Secondary | ICD-10-CM | POA: Insufficient documentation

## 2014-08-11 DIAGNOSIS — R45851 Suicidal ideations: Secondary | ICD-10-CM | POA: Diagnosis present

## 2014-08-11 DIAGNOSIS — F1910 Other psychoactive substance abuse, uncomplicated: Secondary | ICD-10-CM | POA: Diagnosis present

## 2014-08-11 DIAGNOSIS — Z590 Homelessness: Secondary | ICD-10-CM | POA: Diagnosis not present

## 2014-08-11 DIAGNOSIS — G4700 Insomnia, unspecified: Secondary | ICD-10-CM | POA: Diagnosis present

## 2014-08-11 DIAGNOSIS — F410 Panic disorder [episodic paroxysmal anxiety] without agoraphobia: Secondary | ICD-10-CM | POA: Diagnosis present

## 2014-08-11 DIAGNOSIS — F333 Major depressive disorder, recurrent, severe with psychotic symptoms: Principal | ICD-10-CM | POA: Diagnosis present

## 2014-08-11 DIAGNOSIS — F39 Unspecified mood [affective] disorder: Secondary | ICD-10-CM | POA: Diagnosis not present

## 2014-08-11 DIAGNOSIS — F1020 Alcohol dependence, uncomplicated: Secondary | ICD-10-CM | POA: Diagnosis not present

## 2014-08-11 MED ORDER — MIRTAZAPINE 30 MG PO TABS
15.0000 mg | ORAL_TABLET | Freq: Every day | ORAL | Status: DC
Start: 2014-08-11 — End: 2014-08-11

## 2014-08-11 MED ORDER — MIRTAZAPINE 15 MG PO TABS
15.0000 mg | ORAL_TABLET | Freq: Every day | ORAL | Status: DC
  Administered 2014-08-12 – 2014-08-16 (×6): 15 mg via ORAL
  Filled 2014-08-11 (×8): qty 1
  Filled 2014-08-11: qty 14

## 2014-08-11 MED ORDER — IBUPROFEN 800 MG PO TABS: 800.0000 mg | ORAL_TABLET | Freq: Three times a day (TID) | ORAL | Status: DC | PRN

## 2014-08-11 MED ORDER — ACETAMINOPHEN 325 MG PO TABS
650.0000 mg | ORAL_TABLET | Freq: Four times a day (QID) | ORAL | Status: DC | PRN
  Administered 2014-08-13: 650 mg via ORAL
  Filled 2014-08-11: qty 2

## 2014-08-11 MED ORDER — ALUM & MAG HYDROXIDE-SIMETH 200-200-20 MG/5ML PO SUSP: 30.0000 mL | ORAL | Status: DC | PRN

## 2014-08-11 MED ORDER — MAGNESIUM HYDROXIDE 400 MG/5ML PO SUSP: 30.0000 mL | Freq: Every day | ORAL | Status: DC | PRN

## 2014-08-11 NOTE — BH Assessment (Signed)
[facility] Assessment Progress Note  Per Thedore MinsMojeed Akintayo, MD, this pt requires psychiatric hospitalization at this time.  Berneice Heinrichina Tate, RN, [facility] has assigned pt to Rm 301-2.  Pt has signed Voluntary Admission and Consent for Treatment, as well as Consent to Release Information to the [facility] who referred pt to ED.  Signed forms have been faxed to [facility] and a notification call has been place to the [facility].  Pt's nurse, Carlisle BeersLuann, has been notified, and agrees to send original paperwork along with pt via [facility], and to call report to [phone].  Doylene Canninghomas Kenzli Barritt, MA Triage Specialist [phone]

## 2014-08-11 NOTE — Tx Team (Signed)
Initial Interdisciplinary Treatment Plan   PATIENT STRESSORS: Financial difficulties Occupational concerns Substance abuse   PATIENT STRENGTHS: Ability for insight General fund of knowledge Motivation for treatment/growth   PROBLEM LIST: Problem List/Patient Goals Date to be addressed Date deferred Reason deferred Estimated date of resolution  "Depression" 08/11/14     "Anxiety" 08/11/14     "Substance abuse" 08/11/14                                          DISCHARGE CRITERIA:  Ability to meet basic life and health needs Improved stabilization in mood, thinking, and/or behavior Motivation to continue treatment in a less acute level of care  PRELIMINARY DISCHARGE PLAN: Attend PHP/IOP Outpatient therapy  PATIENT/FAMIILY INVOLVEMENT: This treatment plan has been presented to and reviewed with the patient, Robert Foster.  The patient and family have been given the opportunity to ask questions and make suggestions.  Melanee SpryByrd, Vihaan Gloss E 08/11/2014, 4:24 PM

## 2014-08-11 NOTE — ED Notes (Signed)
Calm and cooperative.  Acuity low. 

## 2014-08-11 NOTE — Progress Notes (Signed)
Pt attended NA group this evening.  

## 2014-08-11 NOTE — Progress Notes (Signed)
Admission Note  D: [facility] Patient appropriate and cooperative with staff during the admission. He voiced that he's been dealing with depression and anxiety since his early 20's and does not have any support, including family. Patient reported that he's been abusing cocaine and alcohol; he verbalized that he's been drinking 3-4 fifths of liquor daily and using about $20 worth of cocaine in a week. Patient would like to go to a treatment program after being discharged from the hospital.  A: Support and encouragement provided to patient. Oriented patient to the unit and informed him of the hospital's rules/policies. Initiated Q15 minute checks for safety.  R: Patient receptive. Denies SI/HI and AVH. Patient remains safe on the unit.
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
## 2014-08-12 ENCOUNTER — Encounter (HOSPITAL_COMMUNITY): Payer: Self-pay | Admitting: Psychiatry

## 2014-08-12 DIAGNOSIS — F333 Major depressive disorder, recurrent, severe with psychotic symptoms: Principal | ICD-10-CM

## 2014-08-12 DIAGNOSIS — R443 Hallucinations, unspecified: Secondary | ICD-10-CM

## 2014-08-12 DIAGNOSIS — R45851 Suicidal ideations: Secondary | ICD-10-CM

## 2014-08-12 DIAGNOSIS — F1410 Cocaine abuse, uncomplicated: Secondary | ICD-10-CM | POA: Diagnosis present

## 2014-08-12 DIAGNOSIS — F39 Unspecified mood [affective] disorder: Secondary | ICD-10-CM

## 2014-08-12 DIAGNOSIS — F1020 Alcohol dependence, uncomplicated: Secondary | ICD-10-CM

## 2014-08-12 MED ORDER — CHLORDIAZEPOXIDE HCL 25 MG PO CAPS: 25.0000 mg | ORAL_CAPSULE | Freq: Every day | ORAL | Status: DC

## 2014-08-12 MED ORDER — CHLORDIAZEPOXIDE HCL 25 MG PO CAPS: 25.0000 mg | ORAL_CAPSULE | Freq: Four times a day (QID) | ORAL | Status: AC | PRN

## 2014-08-12 MED ORDER — CHLORDIAZEPOXIDE HCL 25 MG PO CAPS
25.0000 mg | ORAL_CAPSULE | Freq: Three times a day (TID) | ORAL | Status: AC
  Administered 2014-08-13 – 2014-08-14 (×3): 25 mg via ORAL
  Filled 2014-08-12 (×3): qty 1

## 2014-08-12 MED ORDER — CHLORDIAZEPOXIDE HCL 25 MG PO CAPS
25.0000 mg | ORAL_CAPSULE | ORAL | Status: AC
  Administered 2014-08-14 – 2014-08-15 (×2): 25 mg via ORAL
  Filled 2014-08-12 (×2): qty 1

## 2014-08-12 MED ORDER — VITAMIN B-1 100 MG PO TABS
100.0000 mg | ORAL_TABLET | Freq: Every day | ORAL | Status: DC
  Administered 2014-08-13 – 2014-08-17 (×5): 100 mg via ORAL
  Filled 2014-08-12 (×7): qty 1

## 2014-08-12 MED ORDER — CHLORDIAZEPOXIDE HCL 25 MG PO CAPS
25.0000 mg | ORAL_CAPSULE | Freq: Four times a day (QID) | ORAL | Status: AC
  Administered 2014-08-12 – 2014-08-13 (×4): 25 mg via ORAL
  Filled 2014-08-12 (×4): qty 1

## 2014-08-12 MED ORDER — ONDANSETRON 4 MG PO TBDP: 4.0000 mg | ORAL_TABLET | Freq: Four times a day (QID) | ORAL | Status: AC | PRN

## 2014-08-12 MED ORDER — THIAMINE HCL 100 MG/ML IJ SOLN: 100.0000 mg | Freq: Once | INTRAMUSCULAR | Status: DC

## 2014-08-12 MED ORDER — ADULT MULTIVITAMIN W/MINERALS CH
1.0000 | ORAL_TABLET | Freq: Every day | ORAL | Status: DC
  Administered 2014-08-12 – 2014-08-17 (×6): 1 via ORAL
  Filled 2014-08-12: qty 1
  Filled 2014-08-12: qty 14
  Filled 2014-08-12 (×6): qty 1

## 2014-08-12 MED ORDER — HYDROXYZINE HCL 25 MG PO TABS: 25.0000 mg | ORAL_TABLET | Freq: Four times a day (QID) | ORAL | Status: AC | PRN

## 2014-08-12 MED ORDER — LOPERAMIDE HCL 2 MG PO CAPS
2.0000 mg | ORAL_CAPSULE | ORAL | Status: AC | PRN
Start: 2014-08-12 — End: 2014-08-15

## 2014-08-12 NOTE — Plan of Care (Signed)
Problem: Alteration in mood & ability to function due to Goal: STG: Patient verbalizes decreases in signs of withdrawal Outcome: Progressing Client reports decreased s/s of withdrawal with medication compliance, Librium administered as ordered.

## 2014-08-12 NOTE — Progress Notes (Signed)
Patient in bed for most part of the evening. Mood and affects flat and depressed. Patient reported that his day was neither bad nor good; "so-so". He denied SI/HI and denied Hallucinations. Patient reported that he recently moved from [place] to [place] because he was in trouble with some people over there and he had to move to get himself out of trouble. He reported that he was going to School and had to quit going due to the problem. He said he felt very  Depressed. Writer encouraged and supported patient. Q 15 minute check continues as ordered to maintain safety.

## 2014-08-12 NOTE — Progress Notes (Signed)
Adult Psychoeducational Group Note  Date:  08/12/2014 Time:  9:00 AM  Group Topic/Focus:  Orientation:   The focus of this group is to educate the patient on the purpose and policies of crisis stabilization and provide a format to answer questions about their admission.  The group details unit policies and expectations of patients while admitted.  Participation Level:  Did Not Attend  Additional Comments: Patient in bed sleeping during group.  Harold BarbanByrd, [place] Jar[place] E 08/12/2014, 6:04 PM

## 2014-08-12 NOTE — Progress Notes (Signed)
D: Patient's affect appropriate to situation and mood is depressed. He declined completing the self inventory sheet. Patient has been isolating to his room; no interaction with peers or participation in group sessions throughout the day. In adherence with the current medication regimen.  A: Support and encouragement provided to patient. Administered medications per ordering MD. Monitor Q15 minute checks for safety.  R: Patient receptive. Denies SI/HI and AVH. Patient remains safe on the unit.

## 2014-08-12 NOTE — Progress Notes (Signed)
Patient did attend the evening karaoke group. Pt was attentive and supportive but did not participate by singing a song.   

## 2014-08-12 NOTE — [facility] Suicide Risk Assessment (Signed)
[facility]   Nursing information obtained from:    Demographic factors:    Current Mental Status:    Loss Factors:    Historical Factors:    Risk Reduction Factors:    Total Time spent with patient: 45 minutes Principal Problem: Alcohol dependence Diagnosis:   Patient Active Problem List   Diagnosis Date Noted  . Alcohol dependence [F10.20] 08/12/2014  . Cocaine abuse [F14.10] 08/12/2014  . Substance abuse [F19.10] 08/11/2014  . Severe recurrent major depression with psychotic features [F33.3] 08/10/2014     Continued Clinical Symptoms:  Alcohol Use Disorder Identification Test Final Score (AUDIT): 15 The "Alcohol Use Disorders Identification Test", Guidelines for Use in Primary Care, Second Edition.  World Science writerHealth Organization [facility](WHO). Score between 0-7:  no or low risk or alcohol related problems. Score between 8-15:  moderate risk of alcohol related problems. Score between 16-19:  high risk of alcohol related problems. Score 20 or above:  warrants further diagnostic evaluation for alcohol dependence and treatment.   CLINICAL FACTORS:   Depression:   Comorbid alcohol abuse/dependence Insomnia Alcohol/Substance Abuse/Dependencies  Psychiatric Specialty Exam: Physical Exam  ROS  Blood pressure 118/65, pulse 69, temperature 97.8 F (36.6 C), temperature source Oral, resp. rate 18, height 5' 8.5" (1.74 m), weight 79.833 kg (176 lb), SpO2 100 %.Body mass index is 26.37 kg/(m^2).   COGNITIVE FEATURES THAT CONTRIBUTE TO RISK:  Closed-mindedness, Polarized thinking and Thought constriction (tunnel vision)    SUICIDE RISK:   Moderate:  Frequent suicidal ideation with limited intensity, and duration, some specificity in terms of plans, no associated intent, good self-control, limited dysphoria/symptomatology, some risk factors present, and identifiable protective factors, including available and accessible social support.  PLAN OF CARE: Supportive  approach/coping skills/relapse prevention                               Alcohol dependence; Librium detox protocol                               Reassess and address the co morbities  Medical Decision Making:  Review of Psycho-Social Stressors (1), Review or order clinical lab tests (1), Review of Medication Regimen & Side Effects (2) and Review of New Medication or Change in Dosage (2)  I certify that inpatient services furnished can reasonably be expected to improve the patient's condition.   Glen Kesinger A 08/12/2014, 6:10 PM

## 2014-08-12 NOTE — BHH Counselor (Signed)
Adult Comprehensive Assessment  Patient ID: Robert Foster, male   DOB: March 05, 1966, 49 y.o.   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, maintenance Housing / Lack of housing: Homeless in [place] Physical health (include injuries & life threatening diseases): Denies Social relationships: N/A Substance abuse: cocaine-  2-3 times a week $30-40 worth; alcohol use daily- 4-5 fifths of liquor or wine Bereavement / Loss: Denies  Living/Environment/Situation:  Living Arrangements: Alone Living conditions (as described by patient or guardian): Homeless in [place] How long has patient lived in current situation?: March 2016 What is atmosphere in current home: Temporary  Family History:  Marital status: Separated Separated, when?: 2 years What types of issues is patient dealing with in the relationship?: N/A Additional relationship information: N/A Does patient have children?: No  Childhood History:  By whom was/is the patient raised?: Mother Description of patient's relationship with caregiver when they were a child: Didn't get along with mother as a child Patient's description of current relationship with people who raised him/her: No contact with mother currently Does patient have siblings?: Yes Number of Siblings: 2 Description of patient's current relationship with siblings: No contact with brothers Did patient suffer any verbal/emotional/physical/sexual abuse as a child?: No Did patient suffer from severe childhood neglect?: No Has patient ever been sexually abused/assaulted/raped as an adolescent or adult?: No Was the patient ever a victim of a crime or a disaster?: No Witnessed domestic violence?: No Has patient been effected by domestic violence as an adult?: No  Education:   Highest grade of school patient has completed: some college Currently a Consulting civil engineer?: No Learning disability?: No  Employment/Work Situation:   Employment situation: Unemployed Patient's job has been impacted by current illness: No What is the longest time patient has a held a job?: 3 years Where was the patient employed at that time?: Restaurant  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 ) Has patient ever served in Buyer, retail?: No  Financial Resources:   Surveyor, quantity resources: No income Does patient have a Lawyer or guardian?: No  Alcohol/Substance Abuse:   What has been your use of drugs/alcohol within the last 12 months?: cocaine-  2-3 times a week $30-40 worth; alcohol use daily- 4-5 fifths of liquor or wine Alcohol/Substance Abuse Treatment Hx: Past Tx, Inpatient, Past detox If yes, describe treatment: Previous detox in [place] and residential treatment at [facility] Has alcohol/substance abuse ever caused legal problems?: No  Social Support System:   Forensic psychologist System: None Describe Community Support System: Denies supports  Type of faith/religion: Denies How does patient's faith help to cope with current illness?: N/A  Leisure/Recreation:   Leisure and Hobbies: computers  Strengths/Needs:   What things does the patient do well?: Patient is unable to answer In what areas does patient struggle / problems for patient: Difficulty communicating with others, social anxiety, lack of support   Discharge Plan:   Does patient have access to transportation?: Yes Plan for no access to transportation at discharge: Bus pass Will patient be returning to same living situation after discharge?: No Plan for living situation after discharge: Wanting residential treatment Currently receiving community mental health services: No If no, would patient like referral for services when discharged?: Yes (What county?)  Medical sales representative) Does patient have financial barriers related to discharge medications?: Yes Patient description  of barriers related to discharge medications: lack of finances  Summary/Recommendations:     Patient is a 49 year old African American Male admitted for SI, depression, and substance abuse. [place] homeless [place] and has been for several months. He is interested in residential treatment, transitional housing, or shelters at discharge. Patient lacks social supports. Patient will benefit from crisis stabilization, medication evaluation, group therapy, and psycho education in addition to case management for discharge planning. Patient and CSW reviewed pt's identified goals and treatment plan. Pt verbalized understanding and agreed to treatment plan.   Jasime Westergren, [place] CarboKristin L. 08/12/2014

## 2014-08-12 NOTE — Plan of Care (Signed)
Problem: Alteration in mood Goal: LTG-Patient reports reduction in suicidal thoughts (Patient reports reduction in suicidal thoughts and is able to verbalize a safety plan for whenever patient is feeling suicidal)  Outcome: Progressing Client denies SI, remains safe on the unit with q3415mins safety checks.

## 2014-08-12 NOTE — [facility] Suicide Risk Assessment (Signed)
[facility] INPATIENT:  Family/Significant Other Suicide Prevention Education  Suicide Prevention Education:  Patient Refusal for Family/Significant Other Suicide Prevention Education: The patient Robert Foster has refused to provide written consent for family/significant other to be provided Family/Significant Other Suicide Prevention Education during admission and/or prior to discharge.  Physician notified. SPE reviewed with patient and brochure provided. Patient encouraged to return to hospital if having suicidal thoughts, patient verbalized his/her understanding and has no further questions at this time.   Nijee Heatwole, [place] CarboKristin L 08/12/2014, 12:31 PM

## 2014-08-12 NOTE — H&P (Signed)
Psychiatric Admission Assessment Adult  Patient Identification: Robert Foster MRN:  [id number] Date of Evaluation:  08/12/2014 Chief Complaint:  Major Depression Principal Diagnosis: <principal problem not specified> Diagnosis:   Patient Active Problem List   Diagnosis Date Noted  . Substance abuse [F19.10] 08/11/2014  . 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 States the way he has been living his life, being homeless, cant get a job, the addiction gets in the way. States if he can do something with his addiction he can move on. States he might drink 4-5 fifths of wine every day, uses crack cocaine. States he might do cocaine twice a week if someone has it. Sates he has been doing it on and off since he was 76. States he had 9 months clean before he relapsed in March. He was going to [place]. After 9 months he got out of focus. Started worrying about things he did not have to worry about, "women." he was going to meetings, was going to school had a full time job and a part time job. States he has been in [place] 4 weeks. Before he was in [place]. He states he started feeling like wanting to kill himself The initial assessments at the ED were as follow: Robert Foster is an 49 y.o. male. Pt [facility] to [facility]. Pt reports SI. Pt denies HI. Pt reports hearing voices. According to the Pt, he has been hearing voices. Pt states that he has been diagnosed with depression. Pt reports that his depression has worsened. Pt states that he plans to jump off a bridge. Pt admits to 1 previous attempt at the age of 49 y.o. Pt reports being hospitalized 2x in [place] for SI. The Pt reports that he was receiving outpatient treatment at [facility] year ago. Pt admits to SA and alcohol use. Pt uses $20 of cocaine 2x a week. Pt states that he drinks 10 5ths of liquor daily.  According to the Pt, he was prescribed Remeron while he was a Pt at [facility] but he has not been for treatment in 5 years. Pt admits to medication non-compliance.  49 years old was evaluated for Complaint of SI with plan to jump over a bridge or OD on pills . Pt states that He has a hx of depression and is not compliant with medications of Treatment. Patient reports that he also have substance abuse issue using mainly Alcohol and Cocaine. He uses 20 dollar worth of Cocaine a day but was unable to quantify the amount of Alcohol he uses a day. His Alcohol level is Zero while his UDS is positive for Cocaine and Benzos. Patient is homeless but [facility] Pt denies HI but reported hearing voices and was unable to explain what the voices are telling him. He has been hospitalized twice at [facility] [place] for suicidal ideation. He has been accepted for admission and we will be seeking placement at any hospital with available inpatient Psychiatric beds.  Elements:  Location:  alcohol dependence, cocaine abuse with major depression with psychotic features. Quality:  unable to function, unable to get a job, unble to stop using getting more depressed and suicidal. Severity:  severe. Timing:  every day. Duration:  worst in the last 4 weeks after he relapsed this time around. Context:  alcohol and cocaine dependent once relapsed  unable to stop or take his medications causing incresed depression wiht voices and suidial ideas. Associated Signs/Symptoms: Depression Symptoms:  depressed mood, anhedonia, insomnia, fatigue, suicidal thoughts with specific plan, anxiety, panic attacks, loss of energy/fatigue, disturbed sleep, weight loss, (Hypo) Manic Symptoms:  Irritable Mood, Labiality of Mood, Anxiety Symptoms:  Excessive Worry, Psychotic Symptoms:  Hallucinations: Auditory voices tell him to hurt himself, states this happens when he is not on his  medications PTSD Symptoms: Negative Total Time spent with patient: 45 minutes  Past Medical History:  Past Medical History  Diagnosis Date  . Hypertension    History reviewed. No pertinent past surgical history. Family History: History reviewed. No pertinent family history.  States there is no one in his family that he knows who suffer from alcohol, drugs or mental illness Social History:  History  Alcohol Use  . Yes    Comment: 10- 1/5 bottles of wine     History  Drug Use  . Yes  . Special: Cocaine    History   Social History  . Marital Status: Single    Spouse Name: N/A  . Number of Children: N/A  . Years of Education: N/A   Social History Main Topics  . Smoking status: Current Every Day Smoker -- 0.50 packs/day    Types: Cigarettes  . Smokeless tobacco: Not on file  . Alcohol Use: Yes     Comment: 10- 1/5 bottles of wine  . Drug Use: Yes    Special: Cocaine  . Sexual Activity: Not on file   Other Topics Concern  . None   Social History Narrative  States he is separated, wife is in [place], had been married 5 and a half years, no children. He graduated from [employer]. 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 Worked until he came to [employer] Additional Social History:                          Musculoskeletal: Strength & Muscle Tone: within normal limits Gait & Station: normal Patient leans: N/A  Psychiatric Specialty Exam: Physical Exam  Review of Systems  Constitutional: Positive for malaise/fatigue.  HENT: Negative.   Eyes: Negative.   Respiratory:       Smokes half a pack a day  Cardiovascular: Negative.   Gastrointestinal: Negative.   Genitourinary: Negative.   Musculoskeletal: Positive for back pain and joint pain.  Skin: Negative.   Neurological: Negative.   Endo/Heme/Allergies: Negative.   Psychiatric/Behavioral: Positive for depression, suicidal ideas, hallucinations and substance abuse. The  patient is nervous/anxious and has insomnia.     Blood pressure 145/107, pulse 62, temperature 97.8 F (36.6 C), temperature source Oral, resp. rate 18, height 5' 8.5" (1.74 m), weight 79.833 kg (176 lb), SpO2 100 %.Body mass index is 26.37 kg/(m^2).  General Appearance: Disheveled  Eye Sport and exercise psychologist::  Fair  Speech:  Slow and not spontaneous  Volume:  Decreased  Mood:  Anxious and Depressed  Affect:  Restricted  Thought Process:  Coherent and Goal Directed  Orientation:  Full (Time, Place, and Person)  Thought Content:  symptoms events worries concerns  Suicidal Thoughts:  Yes, no plans or intent  Homicidal Thoughts:  No  Memory:  Immediate;   Fair Recent;   Fair Remote;   Fair  Judgement:  Fair  Insight:  Present and Shallow  Psychomotor Activity:  Restlessness  Concentration:  Fair  Recall:  [employer] of Knowledge:Fair  Language: Fair  Akathisia:  No  Handed:  Right  AIMS (if indicated):     Assets:  Desire for Improvement  ADL's:  Intact  Cognition: WNL  Sleep:  Number of Hours: 6.75   Risk to Self: Is patient at risk for suicide?: Yes Risk to Others:   Prior Inpatient Therapy:  States he has been in [facility] in [place]. Denies having been in a residential treatment program before Prior Outpatient Therapy:  was going to [facility] in [place]  Alcohol Screening: 1. How often do you have a drink containing alcohol?: 4 or more times a week 2. How many drinks containing alcohol do you have on a typical day when you are drinking?: 5 or 6 3. How often do you have six or more drinks on one occasion?: Weekly Preliminary Score: 5 4. How often during the last year have you found that you were not able to stop drinking once you had started?: Monthly 5. How often during the last year have you failed to do what was normally expected from you becasue of drinking?: Never 6. How often during the last year have you needed a first drink in the morning to get yourself going  after a heavy drinking session?: Never 7. How often during the last year have you had a feeling of guilt of remorse after drinking?: Weekly 8. How often during the last year have you been unable to remember what happened the night before because you had been drinking?: Less than monthly 9. Have you or someone else been injured as a result of your drinking?: No 10. Has a relative or friend or a doctor or another health worker been concerned about your drinking or suggested you cut down?: No Alcohol Use Disorder Identification Test Final Score (AUDIT): 15 Brief Intervention: Patient declined brief intervention  Allergies:  No Known Allergies Lab Results: No results found for this or any previous visit (from the past 21 hour(s)). Current Medications: Current Facility-Administered Medications  Medication Dose Route Frequency Provider Last Rate Last Dose  . acetaminophen (TYLENOL) tablet 650 mg  650 mg Oral Q6H PRN Delfin Gant, NP      . alum & mag hydroxide-simeth (MAALOX/MYLANTA) 200-200-20 MG/5ML suspension 30 mL  30 mL Oral Q4H PRN Delfin Gant, NP      . ibuprofen (ADVIL,MOTRIN) tablet 800 mg  800 mg Oral TID PRN Delfin Gant, NP      . magnesium hydroxide (MILK OF MAGNESIA) suspension 30 mL  30 mL Oral Daily PRN Delfin Gant, NP      . mirtazapine (REMERON) tablet 15 mg  15 mg Oral QHS Delfin Gant, NP   15 mg at 08/12/14 0058   PTA Medications: Prescriptions prior to admission  Medication Sig Dispense Refill Last Dose  . naproxen (NAPROSYN) 500 MG tablet Take 1 tablet (500 mg total) by mouth 2 (two) times daily with a meal. As needed for pain 20 tablet 0 havent started    Previous Psychotropic Medications: Yes  Remeron, Wellbutrin last time a year ago  Substance Abuse History in the last 12 months:  Yes.      Consequences of Substance Abuse: Legal Consequences:  B and E when intoxicated Withdrawal Symptoms:   Diaphoresis Tremors  Results for orders  placed or performed during the hospital encounter of 08/09/14 (from the past 72 hour(s))  Acetaminophen level     Status: Abnormal   Collection Time: 08/09/14  1:06 PM  Result Value  Ref Range   Acetaminophen (Tylenol), Serum <10.0 (L) 10 - 30 ug/mL    Comment:        THERAPEUTIC CONCENTRATIONS VARY SIGNIFICANTLY. A RANGE OF 10-30 ug/mL MAY BE AN EFFECTIVE CONCENTRATION FOR MANY PATIENTS. HOWEVER, SOME ARE BEST TREATED AT CONCENTRATIONS OUTSIDE THIS RANGE. ACETAMINOPHEN CONCENTRATIONS >150 ug/mL AT 4 HOURS AFTER INGESTION AND >50 ug/mL AT 12 HOURS AFTER INGESTION ARE OFTEN ASSOCIATED WITH TOXIC REACTIONS.   CBC     Status: None   Collection Time: 08/09/14  1:06 PM  Result Value Ref Range   WBC 5.6 4.0 - 10.5 K/uL   RBC 5.35 4.22 - 5.81 MIL/uL   Hemoglobin 16.0 13.0 - 17.0 g/dL   HCT 46.5 39.0 - 52.0 %   MCV 86.9 78.0 - 100.0 fL   MCH 29.9 26.0 - 34.0 pg   MCHC 34.4 30.0 - 36.0 g/dL   RDW 13.9 11.5 - 15.5 %   Platelets 251 150 - 400 K/uL  Comprehensive metabolic panel     Status: Abnormal   Collection Time: 08/09/14  1:06 PM  Result Value Ref Range   Sodium 138 135 - 145 mmol/L   Potassium 3.9 3.5 - 5.1 mmol/L   Chloride 105 96 - 112 mmol/L   CO2 27 19 - 32 mmol/L   Glucose, Bld 92 70 - 99 mg/dL   BUN 14 6 - 23 mg/dL   Creatinine, Ser 1.03 0.50 - 1.35 mg/dL   Calcium 8.6 8.4 - 10.5 mg/dL   Total Protein 6.3 6.0 - 8.3 g/dL   Albumin 3.6 3.5 - 5.2 g/dL   AST 26 0 - 37 U/L   ALT 29 0 - 53 U/L   Alkaline Phosphatase 81 39 - 117 U/L   Total Bilirubin 0.6 0.3 - 1.2 mg/dL   GFR calc non Af Amer 83 (L) >90 mL/min   GFR calc Af Amer >90 >90 mL/min    Comment: (NOTE) The eGFR has been calculated using the CKD EPI equation. This calculation has not been validated in all clinical situations. eGFR's persistently <90 mL/min signify possible Chronic Kidney Disease.    Anion gap 6 5 - 15  Ethanol (ETOH)     Status: None   Collection Time: 08/09/14  1:06 PM  Result Value Ref  Range   Alcohol, Ethyl (B) <5 0 - 9 mg/dL    Comment:        LOWEST DETECTABLE LIMIT FOR SERUM ALCOHOL IS 11 mg/dL FOR MEDICAL PURPOSES ONLY   Salicylate level     Status: None   Collection Time: 08/09/14  1:06 PM  Result Value Ref Range   Salicylate Lvl <4.8 2.8 - 20.0 mg/dL  Urine Drug Screen     Status: Abnormal   Collection Time: 08/09/14  1:10 PM  Result Value Ref Range   Opiates NONE DETECTED NONE DETECTED   Cocaine POSITIVE (A) NONE DETECTED   Benzodiazepines POSITIVE (A) NONE DETECTED   Amphetamines NONE DETECTED NONE DETECTED   Tetrahydrocannabinol NONE DETECTED NONE DETECTED   Barbiturates NONE DETECTED NONE DETECTED    Comment:        DRUG SCREEN FOR MEDICAL PURPOSES ONLY.  IF CONFIRMATION IS NEEDED FOR ANY PURPOSE, NOTIFY LAB WITHIN 5 DAYS.        LOWEST DETECTABLE LIMITS FOR URINE DRUG SCREEN Drug Class       Cutoff (ng/mL) Amphetamine      1000 Barbiturate      200 Benzodiazepine   889 Tricyclics  300 Opiates          300 Cocaine          300 THC              50     Observation Level/Precautions:  15 minute checks  Laboratory:  As per the ED  Psychotherapy:  Individual/group  Medications:  Librium detox protocol/reassess his other psychotropics  Consultations:    Discharge Concerns:  Need for rehab  Estimated LOS: 3-5 days  Other:     Psychological Evaluations: No   Treatment Plan Summary: Daily contact with patient to assess and evaluate symptoms and progress in treatment and Medication management Supportive approach/coping skills Alcohol dependence; librium detox protocol/work a relapse prevention plan/assess and address alcohol cravings. Consider Campral Cocaine abuse: monitor mood instability from coming off the cocaine Mood disorder: reassess for a mood stabilizer/antidepressant  as we detox Hallucinations: reassess for an antipsychotic agent as we detox Evaluate for a residential treatment center  Medical Decision Making:  Review of  Psycho-Social Stressors (1), Review or order clinical lab tests (1), Review of Medication Regimen & Side Effects (2) and Review of New Medication or Change in Dosage (2)  I certify that inpatient services furnished can reasonably be expected to improve the patient's condition.   Robert Foster A 4/21/201610:15 AM

## 2014-08-12 NOTE — [facility] Group Notes (Signed)
[facility] LCSW Group Therapy 08/12/2014 1:15 PM Type of Therapy: Group Therapy Participation Level: Active  Participation Quality: Attentive, Sharing and Supportive  Affect: Depressed and Flat  Cognitive: Alert and Oriented  Insight: Developing/Improving and Engaged  Engagement in Therapy: Developing/Improving and Engaged  Modes of Intervention: Activity, Clarification, Confrontation, Discussion, Education, Exploration, Limit-setting, Orientation, Problem-solving, Rapport Building, Dance movement psychotherapisteality Testing, Socialization and Support  Summary of Progress/Problems: Patient was attentive and engaged with speaker from [employer] [facility]. Patient was attentive to speaker while they shared their story of dealing with mental health and overcoming it. Patient expressed interest in their programs and services and received information on their agency. Patient processed ways they can relate to the speaker.   Robert BruinKristin Hillari Zumwalt, MSW, [employer]CSWA Clinical Social Worker [facility] [phone]

## 2014-08-12 NOTE — Progress Notes (Signed)
Patient ID: Robert Foster, male   DOB: 11-Oct-1965, 49 y.o.   MRN: [id number] D: Client in bed earlier this shift, but got up for karaoke. Client denies SHI.  A: Writer introduced self to client, assessed for withdrawals, encouraged him to report any concerns, medications reviewed and administered as ordered. Staff will monitor q4115min for safety. R: Client is safe on the unit, attended karaoke.

## 2014-08-13 MED ORDER — HYDROCHLOROTHIAZIDE 12.5 MG PO CAPS
12.5000 mg | ORAL_CAPSULE | Freq: Every day | ORAL | Status: DC
  Administered 2014-08-13 – 2014-08-15 (×3): 12.5 mg via ORAL
  Filled 2014-08-13 (×5): qty 1

## 2014-08-13 NOTE — [facility] Group Notes (Signed)
[facility] LCSW Group Therapy  08/13/2014 1:22 PM  Type of Therapy:  Group Therapy  Participation Level:  Did Not Attend-pt invited. Chose to stay in room to rest.    Summary of Progress/Problems: Feelings around Relapse. Group members discussed the meaning of relapse and shared personal stories of relapse, how it affected them and others, and how they perceived themselves during this time. Group members were encouraged to identify triggers, warning signs and coping skills used when facing the possibility of relapse. Social supports were discussed and explored in detail. Post Acute Withdrawal Syndrome (handout provided) was introduced and examined. Pt's were encouraged to ask questions, talk about key points associated with PAWS, and process this information in terms of relapse prevention.   Smart, Vonne Mcdanel LCSWA  08/13/2014, 1:22 PM

## 2014-08-13 NOTE — [facility] Group Notes (Signed)
Adult Psychoeducational Group Note  Date:  08/13/2014 Time:  9:53 PM  Group Topic/Focus:  AA Meeting  Participation Level:  Active  Participation Quality:  Appropriate  Affect:  Flat  Cognitive:  Appropriate  Insight: Appropriate  Engagement in Group:  Engaged  Modes of Intervention:  Discussion and Education  Additional Comments:  Viviann SpareSteven talked about his relapse and how he wants to get back on track.  Caroll RancherLindsay, Hardin Hardenbrook A 08/13/2014, 9:53 PM

## 2014-08-13 NOTE — Plan of Care (Signed)
Problem: Ineffective individual coping Goal: STG: Patient will remain free from self harm Outcome: Progressing Patient remains free from self harm. 15 minute checks continued per protocol for patient safety.   Problem: Alteration in mood & ability to function due to Goal: LTG-Pt reports reduction in suicidal thoughts (Patient reports reduction in suicidal thoughts and is able to verbalize a safety plan for whenever patient is feeling suicidal)  Outcome: Progressing Patient denies having any suicidal thoughts today. Goal: STG-Patient will comply with prescribed medication regimen (Patient will comply with prescribed medication regimen)  Outcome: Progressing Patient has adhered to medication regimen today with ease.  Problem: Diagnosis: Increased Risk For Suicide Attempt Goal: STG-Patient Will Attend All Groups On The Unit Outcome: Not Progressing Patient has not been attending unit groups today and has remained in bed isolative the majority of the day.

## 2014-08-13 NOTE — Progress Notes (Signed)
[facility]  08/13/2014 2:26 PM Robert Foster  MRN:  [id number] Subjective:  Robert Foster endorses that he slept better last night and was able to eat this morning. His BP is staying high. He says he has had BP meds before does not remember which one. He states he wants to go to a residential treatment program. He is denying voices today or suicidal ideas. States he wants to get his life back together. He states he is dealing with a lot of shame and guilt as he had things going well for him  and let other things get in the way. He is still endorsing depression. Principal Problem: Alcohol dependence Diagnosis:   Patient Active Problem List   Diagnosis Date Noted  . Alcohol dependence [F10.20] 08/12/2014  . Cocaine abuse [F14.10] 08/12/2014  . Substance abuse [F19.10] 08/11/2014  . 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 Hypertension    History reviewed. No pertinent past surgical history. Family History: History reviewed. No pertinent family history. Social History:  History  Alcohol Use  . Yes    Comment: 10- 1/5 bottles of wine     History  Drug Use  . Yes  . Special: Cocaine    History   Social History  . Marital Status: Single    Spouse Name: N/A  . Number of Children: N/A  . Years of Education: N/A   Social History Main Topics  . Smoking status: Current Every Day Smoker -- 0.50 packs/day    Types: Cigarettes  . Smokeless tobacco: Not on file  . Alcohol Use: Yes     Comment: 10- 1/5 bottles of wine  . Drug Use: Yes    Special: Cocaine  . Sexual Activity: Not on file   Other Topics Concern  . None   Social History Narrative   Additional History:    Sleep: better than the night before  Appetite:  Fair   Assessment:   Musculoskeletal: Strength & Muscle Tone: within normal limits Gait & Station:  normal Patient leans: N/A   Psychiatric Specialty Exam: Physical Exam  Review of Systems  Constitutional: Positive for malaise/fatigue.  HENT: Negative.   Eyes: Negative.   Respiratory: Negative.   Cardiovascular: Negative.   Gastrointestinal: Negative.   Genitourinary: Negative.   Musculoskeletal: Negative.   Skin: Negative.   Neurological: Positive for weakness.  Endo/Heme/Allergies: Negative.   Psychiatric/Behavioral: Positive for depression and substance abuse. The patient is nervous/anxious.     Blood pressure 136/100, pulse 79, temperature 98.4 F (36.9 C), temperature source Oral, resp. rate 16, height 5' 8.5" (1.74 m), weight 79.833 kg (176 lb), SpO2 100 %.Body mass index is 26.37 kg/(m^2).  General Appearance: Fairly Groomed  Patent attorneyye Contact::  Fair  Speech:  Clear and Coherent, Slow and not spontaneous  Volume:  Decreased  Mood:  Depressed, anxious  Affect:  Depressed and Restricted  Thought Process:  Coherent and Goal Directed  Orientation:  Full (Time, Place, and Person)  Thought Content:  symptoms events worries concerns  Suicidal Thoughts:  No  Homicidal Thoughts:  No  Memory:  Immediate;   Fair Recent;   Fair Remote;   Fair  Judgement:  Fair  Insight:  Present and Shallow  Psychomotor Activity:  Decreased  Concentration:  Fair  Recall:  FiservFair  Fund of Knowledge:Fair  Language: Fair  Akathisia:  No  Handed:  Right  AIMS (if indicated):     Assets:  Desire for Improvement  ADL's:  Intact  Cognition: WNL  Sleep:  Number of Hours: 5     Current Medications: Current Facility-Administered Medications  Medication Dose Route Frequency Provider Last Rate Last Dose  . acetaminophen (TYLENOL) tablet 650 mg  650 mg Oral Q6H PRN Earney Navy, NP      . alum & mag hydroxide-simeth (MAALOX/MYLANTA) 200-200-20 MG/5ML suspension 30 mL  30 mL Oral Q4H PRN Earney Navy, NP      . chlordiazePOXIDE (LIBRIUM) capsule 25 mg  25 mg Oral Q6H PRN Rachael Fee,  MD      . chlordiazePOXIDE (LIBRIUM) capsule 25 mg  25 mg Oral TID Rachael Fee, MD   25 mg at 08/13/14 1155   Followed by  . [START ON 08/14/2014] chlordiazePOXIDE (LIBRIUM) capsule 25 mg  25 mg Oral BH-qamhs Rachael Fee, MD       Followed by  . [START ON 08/16/2014] chlordiazePOXIDE (LIBRIUM) capsule 25 mg  25 mg Oral Daily Rachael Fee, MD      . hydrOXYzine (ATARAX/VISTARIL) tablet 25 mg  25 mg Oral Q6H PRN Rachael Fee, MD      . ibuprofen (ADVIL,MOTRIN) tablet 800 mg  800 mg Oral TID PRN Earney Navy, NP      . loperamide (IMODIUM) capsule 2-4 mg  2-4 mg Oral PRN Rachael Fee, MD      . magnesium hydroxide (MILK OF MAGNESIA) suspension 30 mL  30 mL Oral Daily PRN Earney Navy, NP      . mirtazapine (REMERON) tablet 15 mg  15 mg Oral QHS Earney Navy, NP   15 mg at 08/12/14 2155  . multivitamin with minerals tablet 1 tablet  1 tablet Oral Daily Rachael Fee, MD   1 tablet at 08/13/14 0755  . ondansetron (ZOFRAN-ODT) disintegrating tablet 4 mg  4 mg Oral Q6H PRN Rachael Fee, MD      . thiamine (B-1) injection 100 mg  100 mg Intramuscular Once Rachael Fee, MD   100 mg at 08/12/14 1200  . thiamine (VITAMIN B-1) tablet 100 mg  100 mg Oral Daily Rachael Fee, MD   100 mg at 08/13/14 0347    Lab Results: No results found for this or any previous visit (from the past 48 hour(s)).  Physical Findings: AIMS:  , ,  ,  ,    CIWA:  CIWA-Ar Total: 0 COWS:     Treatment Plan Summary: Daily contact with patient to assess and evaluate symptoms and progress in treatment and Medication management Supportive approach/coping skills Alcohol dependence: continue librium detox protocol/work a relapse prevention plan Cocaine abuse; monitor any mood instability from coming off the cocaine Depression; he was started on Remeron that he used in the past. It has help him sleep. Will hold starting the Wellbutrin until we are farther in his detox Will work with CBT/mindfulness  High  BP; old chart recorded HCTZ 12. 5 mg as one his meds, will restart   Medical Decision Making:  Review of Psycho-Social Stressors (1), Review or order clinical lab tests (1), Review of Medication Regimen & Side Effects (2) and Review of New Medication or Change in Dosage (2)     Kaedon Fanelli A 08/13/2014, 2:26 PM

## 2014-08-13 NOTE — Progress Notes (Signed)
D: Patient is alert and oriented. Pt's mood and affect is depressed and blunted, pt is pleasant upon interaction. Pt denies SI/HI and AVH. Pt rates depression 6/10, hopelessness 5/10, and anxiety 7/10. Pt experiencing HTN, pt denies symptoms, pt reports he has hx of HTN and used to be on a medication but was taken off of it in the Emergency Department in [place] at some point, pt cannot remember why they D/C'd the medication (See docflowsheet-vitals). Pt remains in bed isolative the majority of the day. Pt is not attending unit groups. A: Active listening by RN. Encouragement/Support provided to pt. MD Dub MikesLugo made aware of pt's HTN and will reassess BP frequently. Medication education reviewed with pt. Scheduled medications administered per providers orders (See MAR). 15 minute checks continued per protocol for patient safety.  R: Patient cooperative and receptive to nursing interventions. Pt remains safe.

## 2014-08-13 NOTE — Tx Team (Signed)
Interdisciplinary Treatment Plan Update (Adult)   Date: 08/13/2014  Time Reviewed:9:42 AM  Progress in Treatment:  Attending groups: Intermittently  Participating in groups:  Yes-when he attends  Taking medication as prescribed: Yes  Tolerating medication: Yes  Family/Significant othe contact made: Pt refused to consent to family contact. SPE completed with pt.   Patient understands diagnosis: Yes, AEB seeking treatment for ETOH detox, crack cocaine abuse, depression/mood instability, SI with plan, and for medication stabilization.  Discussing patient identified problems/goals with staff: Yes  Medical problems stabilized or resolved: Yes  Denies suicidal/homicidal ideation: Yes during self report.  Patient has not harmed self or Others: Yes  New problem(s) identified:  Discharge Plan or Barriers: Pt requesting inpatient treatment referrals. [facility] and [facility] referrals made-pending. CSW assessing.  Additional comments: Pt is 49 year old [facility] Pt reports SI. Pt denies HI. Pt reports hearing voices. According to the Pt, he has been hearing voices. Pt states that he has been diagnosed with depression. Pt reports that his depression has worsened. Pt states that he plans to jump off a bridge. Pt admits to 1 previous attempt at the age of 49 y.o. Pt reports being hospitalized 2x in [place] for SI. The Pt reports that [facility]. Pt admits to SA and alcohol use. Pt uses $20 of cocaine 2x a week. Pt states that he drinks 10 5ths of liquor daily. According to the Pt, he was prescribed Remeron while [facility] but he has not been for treatment in 5 years. Pt admits to medication non-compliance. States the way he has been living his life, being homeless, cant get a job, the addiction gets in the way. States if he can do something with his addiction he can move on. States he might drink 4-5 fifths of wine every day,  uses crack cocaine. States he might do cocaine twice a week if someone has it. Sates he has been doing it on and off since he was 12. States he had 9 months clean before he relapsed in March. He was going to AA. After 9 months he got out of focus. Started worrying about things he did not have to worry about, "women." he was going to meetings, was going to school had a full time job and a part time job. [place] in [place]. Before he was in [place]. He states he started feeling like wanting to kill himself. Denies SI/HI at this time.  Reason for Continuation of Hospitalization: Librium taper-withdrawals Medication management Depression/mood instability  Estimated length of stay: 3-5 days  For review of initial/current patient goals, please see plan of care.  Attendees:  Patient:    Family:    Physician: Geoffery LyonsIrving Lugo MD 08/13/2014 9:43 AM   Nursing: Brayton ElBritney RN; Meriam SpragueBeverly RN 08/13/2014 9:43 AM   Clinical Social Worker [employer] Sherwin-WilliamsHeather Smart, LCSWA  08/13/2014 9:43 AM   Other: Earley AbideKristin D. LCSWA  08/13/2014 9:43 AM   Other: Darden DatesJennifer C. Nurse CM 08/13/2014 9:43 AM   Other: Liliane Badeolora Sutton, Community Care Coordinator    Other:    Scribe for Treatment Team:  Trula SladeHeather Smart LCSWA 08/13/2014 9:44 AM

## 2014-08-13 NOTE — [facility] Group Notes (Signed)
[facility]   08/13/2014 9:39 AM  Participation Quality:  Invited-DID NOT ATTEND. Pt chose to remain in bed.   Smart, American FinancialHeather LCSWA

## 2014-08-13 NOTE — Clinical Social Work Note (Signed)
Referral initiated to [facility].  Corneisha Alvi, MSW, LCSWA Clinical Social Worker [facility] [phone]  

## 2014-08-13 NOTE — Progress Notes (Signed)
Pt visible in the milieu. Minimal interaction with staff and peers. Attending group.  Pt reported he is wanting to get clean and would like to go to a long term treatment facility upon discharge. Denied SI, HI and AVH.  Needs assessed. Pt denied. Fifteen minute checks in progress for patient safety. Pt safe on unit.

## 2014-08-13 NOTE — Clinical Social Work Note (Signed)
CSW left voicemail for Jeff at [facility] regarding initiating referral.  Collie Kittel, MSW, LCSWA Clinical Social Worker [facility] [phone] 

## 2014-08-14 MED ORDER — BUPROPION HCL ER (XL) 150 MG PO TB24
150.0000 mg | ORAL_TABLET | Freq: Every day | ORAL | Status: DC
  Administered 2014-08-15 – 2014-08-17 (×3): 150 mg via ORAL
  Filled 2014-08-14 (×4): qty 1
  Filled 2014-08-14: qty 14

## 2014-08-14 NOTE — Progress Notes (Signed)
D.  Pt pleasant on approach, denies complaints at this time.  Had been in bed but did get up and go to evening AA group late.  States that his stomach had been hurting earlier.   Denies SI/HI/hallucinations at this time.  Interacting appropriately with peers on the unit.  A.  Support and encouragement offered  R.  Pt remains safe on unit, will continue to monitor.

## 2014-08-14 NOTE — Progress Notes (Signed)
Patient did not attend the evening speaker AA meeting. Pt returned to his room when group was announced reporting that his stomach was upset.

## 2014-08-14 NOTE — Progress Notes (Signed)
Patient did join in at  the evening speaker AA meeting.

## 2014-08-14 NOTE — [facility] Group Notes (Signed)
[facility] Group Notes:  (Clinical Social Work)  08/14/2014     1:15-2:15PM  Summary of Progress/Problems:   The main focus of today's process group was to learn how to use a decisional balance exercise to move forward in the Stages of Change, which were described and discussed.  Motivational Interviewing and a worksheet were utilized to help patients explore in depth the perceived benefits and costs of a self-sabotaging behavior, as well as the  benefits and costs of replacing that with a healthy coping mechanism.   The patient expressed a great deal of insight, was very interactive in group, talked about his alcohol use and isolation, and all he has lost.  Type of Therapy:  Group Therapy - Process   Participation Level:  Active  Participation Quality:  Attentive, Sharing and Supportive  Affect:  Blunted and Depressed  Cognitive:  Appropriate and Oriented  Insight:  Engaged  Engagement in Therapy:  Engaged  Modes of Intervention:  Education, Motivational Interviewing  Ambrose MantleMareida Grossman-Orr, LCSW 08/14/2014, 4:57 PM

## 2014-08-14 NOTE — Progress Notes (Signed)
[facility] MD Progress Note  08/14/2014 5:01 PM Robert Foster  MRN:  [id number] Subjective:  Robert Foster states he is coming around. States he is starting to have his appetite back. He is endorsing depression States that he has not heard the voices today. He is wanting to get his life back together would like to see if he can resume his schooling. He was enrolled while in [place] but states going back to [place] would not be a good idea for him. Principal Problem: Alcohol dependence Diagnosis:   Patient Active Problem List   Diagnosis Date Noted  . Alcohol dependence [F10.20] 08/12/2014  . Cocaine abuse [F14.10] 08/12/2014  . Substance abuse [F19.10] 08/11/2014  . 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 Hypertension    History reviewed. No pertinent past surgical history. Family History: History reviewed. No pertinent family history. Social History:  History  Alcohol Use  . Yes    Comment: 10- 1/5 bottles of wine     History  Drug Use  . Yes  . Special: Cocaine    History   Social History  . Marital Status: Single    Spouse Name: N/A  . Number of Children: N/A  . Years of Education: N/A   Social History Main Topics  . Smoking status: Current Every Day Smoker -- 0.50 packs/day    Types: Cigarettes  . Smokeless tobacco: Not on file  . Alcohol Use: Yes     Comment: 10- 1/5 bottles of wine  . Drug Use: Yes    Special: Cocaine  . Sexual Activity: Not on file   Other Topics Concern  . None   Social History Narrative   Additional History:    Sleep: Fair  Appetite:  Fair   Assessment:   Musculoskeletal: Strength & Muscle Tone: within normal limits Gait & Station: normal Patient leans: N/A   Psychiatric Specialty Exam: Physical Exam  Review of Systems  Constitutional: Negative.   HENT: Negative.   Eyes: Negative.    Respiratory: Negative.   Cardiovascular: Negative.   Gastrointestinal: Negative.   Genitourinary: Negative.   Musculoskeletal: Negative.   Skin: Negative.   Neurological: Negative.   Endo/Heme/Allergies: Negative.   Psychiatric/Behavioral: Positive for depression and substance abuse. The patient is nervous/anxious.     Blood pressure 126/82, pulse 91, temperature 97.8 F (36.6 C), temperature source Oral, resp. rate 16, height 5' 8.5" (1.74 m), weight 79.833 kg (176 lb), SpO2 100 %.Body mass index is 26.37 kg/(m^2).  General Appearance: Fairly Groomed  Patent attorney::  Fair  Speech:  Clear and Coherent and not spontaneous  Volume:  Decreased  Mood:  Depressed  Affect:  Depressed  Thought Process:  Coherent and Goal Directed  Orientation:  Full (Time, Place, and Person)  Thought Content:  symptoms events worries concerns  Suicidal Thoughts:  No  Homicidal Thoughts:  No  Memory:  Immediate;   Fair Recent;   Fair Remote;   Fair  Judgement:  Fair  Insight:  Present  Psychomotor Activity:  Decreased  Concentration:  Fair  Recall:  [employer] of Knowledge:Fair  Language: Fair  Akathisia:  No  Handed:  Right  AIMS (if indicated):     Assets:  Desire for Improvement  ADL's:  Intact  Cognition: WNL  Sleep:  Number of Hours: 6.75     Current Medications: Current Facility-Administered Medications  Medication Dose Route Frequency Provider Last Rate Last Dose  . acetaminophen (TYLENOL) tablet 650 mg  650 mg Oral Q6H PRN Earney NavyJosephine C Onuoha, NP   650 mg at 08/13/14 2101  . alum & mag hydroxide-simeth (MAALOX/MYLANTA) 200-200-20 MG/5ML suspension 30 mL  30 mL Oral Q4H PRN Earney NavyJosephine C Onuoha, NP      . chlordiazePOXIDE (LIBRIUM) capsule 25 mg  25 mg Oral Q6H PRN Rachael FeeIrving A Mylinh Cragg, MD      . chlordiazePOXIDE (LIBRIUM) capsule 25 mg  25 mg Oral BH-qamhs Rachael FeeIrving A Vicy Medico, MD       Followed by  . [START ON 08/16/2014] chlordiazePOXIDE (LIBRIUM) capsule 25 mg  25 mg Oral Daily Rachael FeeIrving A Matina Rodier, MD       . hydrochlorothiazide (MICROZIDE) capsule 12.5 mg  12.5 mg Oral Daily Rachael FeeIrving A Shereka Lafortune, MD   12.5 mg at 08/14/14 0645  . hydrOXYzine (ATARAX/VISTARIL) tablet 25 mg  25 mg Oral Q6H PRN Rachael FeeIrving A Abdiel Blackerby, MD      . ibuprofen (ADVIL,MOTRIN) tablet 800 mg  800 mg Oral TID PRN Earney NavyJosephine C Onuoha, NP      . loperamide (IMODIUM) capsule 2-4 mg  2-4 mg Oral PRN Rachael FeeIrving A Ronnae Kaser, MD      . magnesium hydroxide (MILK OF MAGNESIA) suspension 30 mL  30 mL Oral Daily PRN Earney NavyJosephine C Onuoha, NP      . mirtazapine (REMERON) tablet 15 mg  15 mg Oral QHS Earney NavyJosephine C Onuoha, NP   15 mg at 08/13/14 2149  . multivitamin with minerals tablet 1 tablet  1 tablet Oral Daily Rachael FeeIrving A Rylie Limburg, MD   1 tablet at 08/14/14 [phone]  . ondansetron (ZOFRAN-ODT) disintegrating tablet 4 mg  4 mg Oral Q6H PRN Rachael FeeIrving A Roselani Grajeda, MD      . thiamine (B-1) injection 100 mg  100 mg Intramuscular Once Rachael FeeIrving A Chelesea Weiand, MD   100 mg at 08/12/14 1200  . thiamine (VITAMIN B-1) tablet 100 mg  100 mg Oral Daily Rachael FeeIrving A Chistian Kasler, MD   100 mg at 08/14/14 [id number]    Lab Results: No results found for this or any previous visit (from the past 48 hour(s)).  Physical Findings: AIMS: Facial and Oral Movements Muscles of Facial Expression: None, normal Lips and Perioral Area: None, normal Jaw: None, normal Tongue: None, normal,Extremity Movements Upper (arms, wrists, hands, fingers): None, normal Lower (legs, knees, ankles, toes): None, normal, Trunk Movements Neck, shoulders, hips: None, normal, Overall Severity Severity of abnormal movements (highest score from questions above): None, normal Incapacitation due to abnormal movements: None, normal Patient's awareness of abnormal movements (rate only patient's report): No Awareness, Dental Status Current problems with teeth and/or dentures?: No Does patient usually wear dentures?: No  CIWA:  CIWA-Ar Total: 0 COWS:     Treatment Plan Summary: Daily contact with patient to assess and evaluate symptoms and progress in  treatment and Medication management Supportive approach/coping skills Alcohol dependence; continue the alcohol detox protocol/work a relapse prevention plan Cocaine abuse; continue to monitor mood instability from coming off the cocaine Depression; will resume his Wellbutrin XL 150 mg in the morning Will work with CBT/mindfulness Will explore residential treatment (rehab) options   Medical Decision Making:  Review of Psycho-Social Stressors (1), Review or order clinical lab tests (1), Review of Medication Regimen & Side Effects (2) and Review of New Medication or Change in Dosage (2)     Karsten Vaughn A 08/14/2014, 5:01 PM

## 2014-08-14 NOTE — Progress Notes (Signed)
D) Pt has been up and about in the milieu. Affect is flat and mood depressed. Pt rates his depression at a 4, his hopelessness at a 3 and his anxiety at a 1. Pt denies SI and HI. Did attend some of the groups.  A) Given support, reassurance and praise along with encouragement. Pt provided with a short 1:1. Blood pressure being monitored throughout the day. R) Denies SI and HI.

## 2014-08-15 MED ORDER — LISINOPRIL 5 MG PO TABS
5.0000 mg | ORAL_TABLET | Freq: Every day | ORAL | Status: DC
  Administered 2014-08-15 – 2014-08-16 (×2): 5 mg via ORAL
  Filled 2014-08-15 (×3): qty 1

## 2014-08-15 MED ORDER — HYDROCHLOROTHIAZIDE 25 MG PO TABS
25.0000 mg | ORAL_TABLET | Freq: Every day | ORAL | Status: DC
  Administered 2014-08-16 – 2014-08-17 (×2): 25 mg via ORAL
  Filled 2014-08-15: qty 14
  Filled 2014-08-15 (×3): qty 1

## 2014-08-15 NOTE — [facility] Group Notes (Signed)
[facility] Group Notes:  (Clinical Social Work)  08/15/2014  3-4PM  Summary of Progress/Problems:   The main focus of today's process group was to   1)  discuss the importance of adding supports  2)  define health supports versus unhealthy supports  3)  identify the patient's current unhealthy supports and plan how to handle them  4)  Identify the patient's current healthy supports and plan what to add.  An emphasis was placed on using counselor, doctor, therapy groups, 12-step groups, and problem-specific support groups to expand supports.    The patient expressed full comprehension of the concepts presented, and agreed that there is a need to add more supports.  The patient stated his major obstacle was that he paid attention only to his substance abuse problem, and told the doctor he did not want to stay on depression medication.  Then when he became depressed again, his mind led him to relapse.  This has been a pattern for him.  He talked about maybe wanting to add an [address], has never previously considered it because does not have a job.  CSW informed him that he would have to find work.  Also discussed [facility].  Type of Therapy:  Process Group with Motivational Interviewing  Participation Level:  Active  Participation Quality:  Attentive, Sharing and Supportive  Affect:  Blunted and Depressed  Cognitive:  Appropriate and Oriented  Insight:  Engaged  Engagement in Therapy:  Engaged  Modes of Intervention:   Education, Support and Processing, Activity  Pilgrim's PrideMareida Grossman-Orr, LCSW 08/15/2014, 12:15pm

## 2014-08-15 NOTE — Progress Notes (Signed)
[facility]  08/15/2014 2:45 PM Robert BoozeSteven Foster  MRN:  [id number] Subjective:  [age] is still dealing with depression. Feels sluggish. Continues to state that he wants to get back in some sort of program that would help him Foster his education as the one he was involved in [place]. He states he is committed to make his plan work. Going back to [place] he continues to affirm is not going to be good for him. He wants to have a brand new start somewhere else. He is back on Wellbutrin as of in this morning. Denies any hallucinations or suicidal ideas at this time Principal Problem: Alcohol dependence Diagnosis:   Patient Active Problem List   Diagnosis Date Noted  . Alcohol dependence [F10.20] 08/12/2014  . Cocaine abuse [F14.10] 08/12/2014  . Substance abuse [F19.10] 08/11/2014  . 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 Hypertension    History reviewed. No pertinent past surgical history. Family History: History reviewed. No pertinent family history. Social History:  History  Alcohol Use  . Yes    Comment: 10- 1/5 bottles of wine     History  Drug Use  . Yes  . Special: Cocaine    History   Social History  . Marital Status: Single    Spouse Name: N/A  . Number of Children: N/A  . Years of Education: N/A   Social History Main Topics  . Smoking status: Current Every Day Smoker -- 0.50 packs/day    Types: Cigarettes  . Smokeless tobacco: Not on file  . Alcohol Use: Yes     Comment: 10- 1/5 bottles of wine  . Drug Use: Yes    Special: Cocaine  . Sexual Activity: Not on file   Other Topics Concern  . None   Social History Narrative   Additional History:    Sleep: Fair  Appetite:  Fair   Assessment:   Musculoskeletal: Strength & Muscle Tone: within normal limits Gait & Station: normal Patient leans:  N/A   Psychiatric Specialty Exam: Physical Exam  Review of Systems  Constitutional: Negative.   HENT: Negative.   Eyes: Negative.   Respiratory: Negative.   Cardiovascular: Negative.   Gastrointestinal: Negative.   Genitourinary: Negative.   Musculoskeletal: Negative.   Skin: Negative.   Neurological: Negative.   Endo/Heme/Allergies: Negative.   Psychiatric/Behavioral: Positive for depression and substance abuse. The patient is nervous/anxious.     Blood pressure 147/104, pulse 83, temperature 97.7 F (36.5 C), temperature source Oral, resp. rate 20, height 5' 8.5" (1.74 m), weight 79.833 kg (176 lb), SpO2 100 %.Body mass index is 26.37 kg/(m^2).  General Appearance: Fairly Groomed  Patent attorneyye Contact::  Fair  Speech:  Clear and Coherent  Volume:  Decreased  Mood:  Anxious, Depressed and worried  Affect:  Restricted  Thought Process:  Coherent and Goal Directed  Orientation:  Full (Time, Place, and Person)  Thought Content:  symptoms events worries concerns  Suicidal Thoughts:  No  Homicidal Thoughts:  No  Memory:  Immediate;   Fair Recent;   Fair Remote;   Fair  Judgement:  Fair  Insight:  Present and Shallow  Psychomotor Activity:  Decreased  Concentration:  Fair  Recall:  [employer]is[employer]Fair  Fund of Knowledge:Fair  Language: Fair  Akathisia:  No  Handed:  Right  AIMS (if indicated):     Assets:  Desire for Improvement  ADL's:  Intact  Cognition: WNL  Sleep:  Number of Hours: 6     Current Medications: Current Facility-Administered Medications  Medication Dose Route Frequency Provider Last Rate Last Dose  . acetaminophen (TYLENOL) tablet 650 mg  650 mg Oral Q6H PRN Earney Navy, NP   650 mg at 08/13/14 2101  . alum & mag hydroxide-simeth (MAALOX/MYLANTA) 200-200-20 MG/5ML suspension 30 mL  30 mL Oral Q4H PRN Earney Navy, NP      . buPROPion (WELLBUTRIN XL) 24 hr tablet 150 mg  150 mg Oral Daily Rachael Fee, MD   150 mg at 08/15/14 0845  . [START ON 08/16/2014]  hydrochlorothiazide (HYDRODIURIL) tablet 25 mg  25 mg Oral Daily Rachael Fee, MD      . ibuprofen (ADVIL,MOTRIN) tablet 800 mg  800 mg Oral TID PRN Earney Navy, NP      . lisinopril (PRINIVIL,ZESTRIL) tablet 5 mg  5 mg Oral Daily Rachael Fee, MD   5 mg at 08/15/14 1303  . magnesium hydroxide (MILK OF MAGNESIA) suspension 30 mL  30 mL Oral Daily PRN Earney Navy, NP      . mirtazapine (REMERON) tablet 15 mg  15 mg Oral QHS Earney Navy, NP   15 mg at 08/14/14 2127  . multivitamin with minerals tablet 1 tablet  1 tablet Oral Daily Rachael Fee, MD   1 tablet at 08/15/14 0845  . thiamine (B-1) injection 100 mg  100 mg Intramuscular Once Rachael Fee, MD   100 mg at 08/12/14 1200  . thiamine (VITAMIN B-1) tablet 100 mg  100 mg Oral Daily Rachael Fee, MD   100 mg at 08/15/14 0845    Lab Results: No results found for this or any previous visit (from the past 48 hour(s)).  Physical Findings: AIMS: Facial and Oral Movements Muscles of Facial Expression: None, normal Lips and Perioral Area: None, normal Jaw: None, normal Tongue: None, normal,Extremity Movements Upper (arms, wrists, hands, fingers): None, normal Lower (legs, knees, ankles, toes): None, normal, Trunk Movements Neck, shoulders, hips: None, normal, Overall Severity Severity of abnormal movements (highest score from questions above): None, normal Incapacitation due to abnormal movements: None, normal Patient's awareness of abnormal movements (rate only patient's report): No Awareness, Dental Status Current problems with teeth and/or dentures?: No Does patient usually wear dentures?: No  CIWA:  CIWA-Ar Total: 1 COWS:     Treatment Plan Summary: Daily contact with patient to assess and evaluate symptoms and progress in treatment and Medication management Supportive approach/coping skills Alcohol dependence: will complete the detox protocol and continue to work a relapse prevention plan Depression; was  started on Wellbutrin this morning, will continue to reassess benefit and the presence of side effects. High BP; he remembered he was also taking Lisinopril, will increase the HCTZ to 25 mg and add Lisinopril 5 mg daily and reassess  Will explore placement/residential treatment options he could benefit from Will continue to work with mindfulness Medical Decision Making:  Review of Psycho-Social Stressors (1), Review of Medication Regimen & Side Effects (2) and Review of New Medication or Change in Dosage (2)     Perry Brucato A 08/15/2014, 2:45 PM

## 2014-08-15 NOTE — Progress Notes (Signed)
D) Pt has attended the groups and is interacting with his peers. Affect remains flat and mood depressed. Did share some insight during the day and states, "I know things but as you can see, I am back here again because I was drinking". Pt rates his depression at a 0, his hopelessness at a 1 and  And his anxiety at a 1. Affect is flat and mood depressed. A) Given support, reassurance and praise. Encouragement provided. Provided with a 1:1 R) Denies SI and HI.

## 2014-08-15 NOTE — Progress Notes (Signed)
D:Patient in the dayroom on approach.  Patient states he had a good day.  Patient states he is here to get himself together.  Patient appears to have insight and wants long term treatment.  Patient states his goal for today was to get up out of bed.  Patient states he met his goal and states he is going to continue to get out of bed and be more visible. Patient denies SI/HI and denies AVH.   A: Staff to monitor Q 15 mins for safety.  Encouragement and support offered.  Scheduled medications administered per orders.   R: Patient remains safe on the unit.  Patient attended group tonight.  Patient visible on the unit and interacting with peers.  Patient taking administered medications.

## 2014-08-15 NOTE — Progress Notes (Signed)
Patient did attend the evening speaker AA meeting.  

## 2014-08-16 NOTE — Plan of Care (Signed)
Problem: Alteration in mood & ability to function due to Goal: LTG-Pt reports reduction in suicidal thoughts (Patient reports reduction in suicidal thoughts and is able to verbalize a safety plan for whenever patient is feeling suicidal)  Outcome: Progressing Patient denies SI patient verbally contracts for safety.

## 2014-08-16 NOTE — Progress Notes (Signed)
Recreation Therapy Notes  Date: 04.25.2016 Time: 9:30am Location: [address] Group Room   Group Topic: Stress Management  Goal Area(s) Addresses:  Patient will actively participate in stress management techniques presented during session.   Behavioral Response: Did not attend.   Marykay Lexenise L Iden Stripling, LRT/CTRS  Avonte Sensabaugh L 08/16/2014 10:21 AM

## 2014-08-16 NOTE — [facility] Group Notes (Signed)
   [facility]  08/16/2014  8:45 AM   Participation Quality: Alert, Appropriate and Oriented  Mood/Affect: Depressed and Flat  Depression Rating: 0  Anxiety Rating: 0  Thoughts of Suicide: Pt denies SI/HI  Will you contract for safety? Yes  Current AVH: Pt denies  Plan for Discharge/Comments: Pt attended discharge planning group and actively participated in group. CSW provided pt with today's workbook. Patient denies depression or anxiety although he presented with flat and depressed affect. [facility] CSW will provide patient with information on emergency housing and bus passes.  Transportation Means: Pt reports access to transportation- will be provided with bus pass.  Supports: No supports mentioned at this time  Samuella BruinKristin Ramon Foster, MSW, [employer] IncLCSWA Clinical Social Worker [facility] [phone]

## 2014-08-16 NOTE — [facility] Group Notes (Signed)
[facility] LCSW Group Therapy 08/16/2014  1:15 pm  Type of Therapy: Group Therapy Participation Level: Active  Participation Quality: Attentive, Sharing and Supportive  Affect: Depressed and Flat  Cognitive: Alert and Oriented  Insight: Developing/Improving and Engaged  Engagement in Therapy: Developing/Improving and Engaged  Modes of Intervention: Clarification, Confrontation, Discussion, Education, Exploration,  Limit-setting, Orientation, Problem-solving, Rapport Building, Dance movement psychotherapisteality Testing, Socialization and Support  Summary of Progress/Problems: Pt identified obstacles faced currently and processed barriers involved in overcoming these obstacles. Pt identified steps necessary for overcoming these obstacles and explored motivation (internal and external) for facing these difficulties head on. Pt further identified one area of concern in their lives and chose a goal to focus on for today. Patient actively listened during group but did not participate in discussion despite encouragement.  Samuella BruinKristin Maryagnes Carrasco, MSW, [employer]LCSWA Clinical Social Worker [facility] [phone]

## 2014-08-16 NOTE — Progress Notes (Signed)
D:Patient in the dayroom on approach.  Patient states he had a good day.  Patient appears depressed and not interacting with peers tonight.  Patient states his long term goal is to get back into school.  Patient states he learned that you have to work hard at getting better.  Patient denies SI/HI and AVH.   A: Staff to monitor Q 15 mins for safety.  Encouragement and support offered.  Scheduled medications administered per orders. R: Patient remains safe on the unit.  Patient attended group tonight.  Patient visible on the unit.  Patient taking administered medications.

## 2014-08-16 NOTE — Progress Notes (Signed)
[facility] MD Progress Note  08/16/2014 1:45 PM Robert Foster  MRN:  [id number] Subjective:  [place] states he is starting to sleep better. He is still endorsing depression but understands he has been off the antidepressants for a while so it is going to take some time before they work for him. He is planning to go to the [employer] or try a [facility] in [place] or [place] Principal Problem: Alcohol dependence Diagnosis:   Patient Active Problem List   Diagnosis Date Noted  . Alcohol dependence [F10.20] 08/12/2014  . Cocaine abuse [F14.10] 08/12/2014  . Substance abuse [F19.10] 08/11/2014  . 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 Hypertension    History reviewed. No pertinent past surgical history. Family History: History reviewed. No pertinent family history. Social History:  History  Alcohol Use  . Yes    Comment: 10- 1/5 bottles of wine     History  Drug Use  . Yes  . Special: Cocaine    History   Social History  . Marital Status: Single    Spouse Name: N/A  . Number of Children: N/A  . Years of Education: N/A   Social History Main Topics  . Smoking status: Current Every Day Smoker -- 0.50 packs/day    Types: Cigarettes  . Smokeless tobacco: Not on file  . Alcohol Use: Yes     Comment: 10- 1/5 bottles of wine  . Drug Use: Yes    Special: Cocaine  . Sexual Activity: Not on file   Other Topics Concern  . None   Social History Narrative   Additional History:    Sleep: Fair  Appetite:  Fair   Assessment:   Musculoskeletal: Strength & Muscle Tone: within normal limits Gait & Station: normal Patient leans: N/A   Psychiatric Specialty Exam: Physical Exam  Review of Systems  Constitutional: Negative.   HENT: Negative.   Eyes: Negative.   Respiratory: Negative.   Cardiovascular: Negative.    Gastrointestinal: Negative.   Genitourinary: Negative.   Musculoskeletal: Negative.   Skin: Negative.   Neurological: Negative.   Endo/Heme/Allergies: Negative.   Psychiatric/Behavioral: Positive for depression and substance abuse.    Blood pressure 143/101, pulse 84, temperature 97.8 F (36.6 C), temperature source Oral, resp. rate 16, height 5' 8.5" (1.74 m), weight 79.833 kg (176 lb), SpO2 97 %.Body mass index is 26.37 kg/(m^2).  General Appearance: Fairly Groomed  Patent attorney::  Fair  Speech:  Clear and Coherent and not spontaneous  Volume:  Decreased  Mood:  Depressed  Affect:  Restricted  Thought Process:  Coherent and Goal Directed  Orientation:  Full (Time, Place, and Person)  Thought Content:  symptoms events worries concerns  Suicidal Thoughts:  No  Homicidal Thoughts:  No  Memory:  Immediate;   Fair Recent;   Fair Remote;   Fair  Judgement:  Fair  Insight:  Present and Shallow  Psychomotor Activity:  Decreased  Concentration:  Fair  Recall:  [employer] of Knowledge:Fair  Language: Fair  Akathisia:  No  Handed:  Left  AIMS (if indicated):     Assets:  Desire for Improvement  ADL's:  Intact  Cognition: WNL  Sleep:  Number of Hours: 5.25     Current Medications: Current Facility-Administered Medications  Medication Dose Route Frequency Provider Last Rate Last Dose  . acetaminophen (TYLENOL)  tablet 650 mg  650 mg Oral Q6H PRN Earney NavyJosephine C Onuoha, NP   650 mg at 08/13/14 2101  . alum & mag hydroxide-simeth (MAALOX/MYLANTA) 200-200-20 MG/5ML suspension 30 mL  30 mL Oral Q4H PRN Earney NavyJosephine C Onuoha, NP      . buPROPion (WELLBUTRIN XL) 24 hr tablet 150 mg  150 mg Oral Daily Rachael FeeIrving A Curlie Macken, MD   150 mg at 08/16/14 0801  . hydrochlorothiazide (HYDRODIURIL) tablet 25 mg  25 mg Oral Daily Rachael FeeIrving A Develle Sievers, MD   25 mg at 08/16/14 0801  . ibuprofen (ADVIL,MOTRIN) tablet 800 mg  800 mg Oral TID PRN Earney NavyJosephine C Onuoha, NP      . lisinopril (PRINIVIL,ZESTRIL) tablet 5 mg  5 mg  Oral Daily Rachael FeeIrving A Abdulla Pooley, MD   5 mg at 08/16/14 0801  . magnesium hydroxide (MILK OF MAGNESIA) suspension 30 mL  30 mL Oral Daily PRN Earney NavyJosephine C Onuoha, NP      . mirtazapine (REMERON) tablet 15 mg  15 mg Oral QHS Earney NavyJosephine C Onuoha, NP   15 mg at 08/15/14 2157  . multivitamin with minerals tablet 1 tablet  1 tablet Oral Daily Rachael FeeIrving A Victory Strollo, MD   1 tablet at 08/16/14 0801  . thiamine (B-1) injection 100 mg  100 mg Intramuscular Once Rachael FeeIrving A Shadrick Senne, MD   100 mg at 08/12/14 1200  . thiamine (VITAMIN B-1) tablet 100 mg  100 mg Oral Daily Rachael FeeIrving A Shelene Krage, MD   100 mg at 08/16/14 0800    Lab Results: No results found for this or any previous visit (from the past 48 hour(s)).  Physical Findings: AIMS: Facial and Oral Movements Muscles of Facial Expression: None, normal Lips and Perioral Area: None, normal Jaw: None, normal Tongue: None, normal,Extremity Movements Upper (arms, wrists, hands, fingers): None, normal Lower (legs, knees, ankles, toes): None, normal, Trunk Movements Neck, shoulders, hips: None, normal, Overall Severity Severity of abnormal movements (highest score from questions above): None, normal Incapacitation due to abnormal movements: None, normal Patient's awareness of abnormal movements (rate only patient's report): No Awareness, Dental Status Current problems with teeth and/or dentures?: No Does patient usually wear dentures?: No  CIWA:  CIWA-Ar Total: 0 COWS:     Treatment Plan Summary: Daily contact with patient to assess and evaluate symptoms and progress in treatment and Medication management Supportive approach/coping skills Alcohol dependence/cocaine abuse; continue to work a relapse prevention plan Major Depression: will increase the Wellbutrin XL to 300 mg in AM Will continue the Remeron 15 mg daily BP will continue to monitor Medical Decision Making:  Review of Psycho-Social Stressors (1), Review of Medication Regimen & Side Effects (2) and Review of New  Medication or Change in Dosage (2)     Arielle Eber A 08/16/2014, 1:45 PM

## 2014-08-16 NOTE — Progress Notes (Signed)
Pt has been up and active today.  He rated his depression 1 and denies any hopeless or anxiety on his self-inventory. He denied any A/V/H or S/I or H/I. We discussed how he needed to change positions slowly if feeling dizzy or lightheaded.  He voiced understanding and did demonstrate how he plans to get up from bed/chair.  [facility]

## 2014-08-17 DIAGNOSIS — F10230 Alcohol dependence with withdrawal, uncomplicated: Secondary | ICD-10-CM | POA: Insufficient documentation

## 2014-08-17 MED ORDER — ADULT MULTIVITAMIN W/MINERALS CH: 1.0000 | ORAL_TABLET | Freq: Every day | ORAL | Status: DC

## 2014-08-17 MED ORDER — MIRTAZAPINE 15 MG PO TABS: 15.0000 mg | ORAL_TABLET | Freq: Every day | ORAL | Status: DC

## 2014-08-17 MED ORDER — HYDROCHLOROTHIAZIDE 25 MG PO TABS: 25.0000 mg | ORAL_TABLET | Freq: Every day | ORAL | Status: DC

## 2014-08-17 MED ORDER — BUPROPION HCL ER (XL) 150 MG PO TB24: 150.0000 mg | ORAL_TABLET | Freq: Every day | ORAL | Status: DC

## 2014-08-17 NOTE — Progress Notes (Signed)
  [facility] :  Will you be returning to the same living situation after discharge:  No, [facility] At discharge, do you have transportation home?: Yes,  patient will be provided with [employer]tra[employer] ticket Do you have the ability to pay for your medications: Yes,  patient will be provided with medication samples and prescriptions at discharge  Release of information consent forms completed and in the chart;  Patient's signature needed at discharge.  Patient to Follow up at: Follow-up Information    [facility] On [facility]19/[facility].   Why:  Assessment for therapy and medication management services on Thursday April 28th at 1:45 pm. Please call office if you need to reschedule. Please bring letter from shelter verifying your that you are a resident and are unemployed.   Contact information:   [address] [place] [address] [phone] Fax [phone]      [facility]   Why:  Please contact for appointment for follow up of elevated blood pressure. The medicine HCTZ 25 mg daily was started.  You will need to provide a letter from the shelter stating that you are a resident and unemployed.   Contact information:   [address] [place], [place] [address] [phone]      Patient denies SI/HI: Yes,  denies    Safety Planning and Suicide Prevention discussed: Yes,  with patient  Have you used any form of tobacco in the last 30 days? (Cigarettes, Smokeless Tobacco, Cigars, and/or Pipes): No  Has patient been referred to the Quitline?: N/A patient is not a smoker  Shelsea Hangartner, Belenda CruiseKristin L 08/17/2014, 11:17 AM

## 2014-08-17 NOTE — Progress Notes (Signed)
Pt alert and oriented. Pt calm and cooperative. Pt reports sleeping well and has been compliant with his medications. Pt reports being ready to discharge. Pt was give train ticket and discharge instructions. Pt was also given sample meds and prescription. Pt denies any SI/HI/AH/VH. No current concerns. Pt discharged at 1541.

## 2014-08-17 NOTE — Tx Team (Signed)
Interdisciplinary Treatment Plan Update (Adult)   Date: 08/17/2014  Time Reviewed:12:33 PM  Progress in Treatment:  Attending groups: Intermittently  Participating in groups:  Minimally Taking medication as prescribed: Yes  Tolerating medication: Yes  Family/Significant othe contact made: Pt refused to consent to family contact. SPE completed with pt.   Patient understands diagnosis: Yes, AEB seeking treatment for ETOH detox, crack cocaine abuse, depression/mood instability, SI with plan, and for medication stabilization.  Discussing patient identified problems/goals with staff: Yes  Medical problems stabilized or resolved: Yes  Denies suicidal/homicidal ideation: Yes during self report.  Patient has not harmed self or Others: Yes  New problem(s) identified:  Discharge Plan or Barriers:  4/22: Pt requesting inpatient treatment referrals. [facility] and [facility] referrals made-pending. CSW assessing.   4/26: Patient plans to [facility] with [facility] services Additional comments: Pt is 49 year old male who [facility]. Pt reports SI. Pt denies HI. Pt reports hearing voices. According to the Pt, he has been hearing voices. Pt states that he has been diagnosed with depression. Pt reports that his depression has worsened. Pt states that he plans to jump off a bridge. Pt admits to 1 previous attempt at the age of 49 y.o. Pt reports being hospitalized 2x in [place] for SI. The Pt reports that he was receiving outpatient treatment at [facility] year ago. Pt admits to SA and alcohol use. Pt uses $20 of cocaine 2x a week. Pt states that he drinks 10 5ths of liquor daily. According to the Pt, he was prescribed Remeron while he was a Pt at [facility] but he has not been for treatment in 5 years. Pt admits to medication non-compliance. States the way he has been living his life, being homeless, cant get a job, the addiction gets in the way. States if he can do something with  his addiction he can move on. States he might drink 4-5 fifths of wine every day, uses crack cocaine. States he might do cocaine twice a week if someone has it. Sates he has been doing it on and off since he was 12. States he had 9 months clean before he relapsed in March. He was going to AA. After 9 months he got out of focus. Started worrying about things he did not have to worry about, "women." he was going to meetings, was going to school had a full time job and a part time job. States he has been in [place] 4 weeks. Before he was in [place]. He states he started feeling like wanting to kill himself. Denies SI/HI at this time.  Reason for Continuation of Hospitalization: Librium taper-withdrawals Medication management Depression/mood instability  Estimated length of stay: Discharge anticipated for today 4/26. For review of initial/current patient goals, please see plan of care.  Attendees: Patient:    Family:    Physician: Dr. Jama Flavorsobos; Dr. Dub MikesLugo 08/17/2014 9:30 AM  Nursing: Dellia CloudAndrea Thorne, Milon ScoreBrittany Guthrie, Kim Okafor, RN 08/17/2014 9:30 AM  Clinical Social Worker: Samuella BruinKristin Terell Kincy, LCSWA 08/17/2014 9:30 AM  Other: Juline PatchQuylle Hodnett, LCSW 08/17/2014 9:30 AM  Other: Leisa LenzValerie Enoch, [facility] Liaison 08/17/2014 9:30 AM  Other: Onnie BoerJennifer Clark, Case Manager 08/17/2014 9:30 AM  Other: Mosetta AnisAggie Nwoko, Laura Davis, NP 08/17/2014 9:30 AM  Other: Trula SladeHeather Smart, LCSWA 08/17/2014 9:30 AM  Other:    Other:         Samuella BruinKristin Melah Ebling, MSW, LCSWA Clinical Social Worker [facility] [phone]

## 2014-08-17 NOTE — [facility] Group Notes (Signed)
[facility] LCSW Group Therapy 08/17/2014  1:15 PM   Type of Therapy: Group Therapy  Participation Level: Did Not Attend. Patient invited to attend but declined.    Tammela Bales, MSW, LCSWA Clinical Social Worker [facility] [phone]   

## 2014-08-17 NOTE — [facility] Suicide Risk Assessment (Signed)
[facility]   Demographic Factors:  Male  Total Time spent with patient: 30 minutes  Musculoskeletal: Strength & Muscle Tone: within normal limits Gait & Station: normal Patient leans: N/A  Psychiatric Specialty Exam: Physical Exam  Review of Systems  Constitutional: Negative.   HENT: Negative.   Eyes: Negative.   Respiratory: Negative.   Cardiovascular: Negative.   Gastrointestinal: Negative.   Genitourinary: Negative.   Musculoskeletal: Negative.   Skin: Negative.   Neurological: Negative.   Endo/Heme/Allergies: Negative.   Psychiatric/Behavioral: Positive for depression and substance abuse. The patient is nervous/anxious.     Blood pressure 136/84, pulse 99, temperature 97.6 F (36.4 C), temperature source Oral, resp. rate 17, height 5' 8.5" (1.74 m), weight 79.833 kg (176 lb), SpO2 97 %.Body mass index is 26.37 kg/(m^2).  General Appearance: Fairly Groomed  Patent attorneyye Contact::  Fair  Speech:  Clear and Coherent409  Volume:  Decreased  Mood:  Anxious  Affect:  Restricted  Thought Process:  Coherent and Goal Directed  Orientation:  Full (Time, Place, and Person)  Thought Content:  plans as he moves on, relapse prevention plan  Suicidal Thoughts:  No  Homicidal Thoughts:  No  Memory:  Immediate;   Fair Recent;   Fair Remote;   Fair  Judgement:  Fair  Insight:  Present  Psychomotor Activity:  Decreased  Concentration:  Fair  Recall:  FiservFair  Fund of Knowledge:Fair  Language: Fair  Akathisia:  No  Handed:  Right  AIMS (if indicated):     Assets:  Desire for Improvement  Sleep:  Number of Hours: 5.25  Cognition: WNL  ADL's:  Intact   Have you used any form of tobacco in the last 30 days? (Cigarettes, Smokeless Tobacco, Cigars, and/or Pipes): No  Has this patient used any form of tobacco in the last 30 days? (Cigarettes, Smokeless Tobacco, Cigars, and/or Pipes) No  Mental Status Per Nursing Assessment::   On Admission:     Current  Mental Status by Physician: In full contact with reality. There are no active S/S of withdrawal. There are no active SI plans on intent. He is willing and motivated to pursue outpatient treatment, long term abstinence. Will go to [facility] and pursue treatment and further his education.   Loss Factors: NA  Historical Factors: NA  Risk Reduction Factors:   wants to do better has plans for himself  Continued Clinical Symptoms:  Depression:   Comorbid alcohol abuse/dependence Alcohol/Substance Abuse/Dependencies  Cognitive Features That Contribute To Risk:  Closed-mindedness, Polarized thinking and Thought constriction (tunnel vision)    Suicide Risk:  Minimal: No identifiable suicidal ideation.  Patients presenting with no risk factors but with morbid ruminations; may be classified as minimal risk based on the severity of the depressive symptoms  Principal Problem: Alcohol dependence Discharge Diagnoses:  Patient Active Problem List   Diagnosis Date Noted  . Alcohol dependence [F10.20] 08/12/2014  . Cocaine abuse [F14.10] 08/12/2014  . Substance abuse [F19.10] 08/11/2014  . 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   Why:  Assessment for therapy and medication management services on Thursday April 28th at 1:45 pm. Please call office if you need to reschedule. Please bring letter from shelter verifying your that you are a resident and are unemployed.   Contact information:   [address], [place] [place] [id number] [phone] Fax [phone]  Follow up with [facility].   Why:  Please contact for appointment for follow up of elevated blood pressure. The medicine HCTZ 25 mg daily was started.  You will need to provide a letter from the shelter stating that you are a resident and unemployed.   Contact information:    [address] [place], [place] [address] [phone]      Plan Of Care/Follow-up recommendations:  Activity:  as tolerated Diet:  regular Follow up [facility] and as above Is patient on multiple antipsychotic therapies at discharge:  No   Has Patient had three or more failed trials of antipsychotic monotherapy by history:  No  Recommended Plan for Multiple Antipsychotic Therapies: NA    Riyana Biel A 08/17/2014, 12:32 PM

## 2014-08-17 NOTE — Discharge Summary (Addendum)
Physician Discharge Summary Note  Patient:  Robert Foster is an 49 y.o., male MRN:  [id number] DOB:  [birth year] Patient phone:  [phone] (home)  Patient address:   [address] [place] [address],  Total Time spent with patient: 30 minutes  Date of Admission:  08/11/2014 Date of Discharge: 08/17/14  Reason for Admission:  Depression, Alcohol abuse  Principal Problem: Alcohol dependence Discharge Diagnoses: Patient Active Problem List   Diagnosis Date Noted  . Alcohol dependence [F10.20] 08/12/2014  . Cocaine abuse [F14.10] 08/12/2014  . Substance abuse [F19.10] 08/11/2014  . 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 His speech is normal and behavior is normal. Judgment and thought content normal. Cognition and memory are normal.    Review of Systems  Constitutional: Negative.   HENT: Negative.   Eyes: Negative.   Respiratory: Negative.   Cardiovascular: Negative.   Gastrointestinal: Negative.   Genitourinary: Negative.   Musculoskeletal: Negative.   Skin: Negative.   Neurological: Negative.   Endo/Heme/Allergies: Negative.   Psychiatric/Behavioral: Positive for depression (Stabilized with treatments) and substance abuse (Prior alcohol abuse ). Negative for suicidal ideas, hallucinations and memory loss. The patient is not nervous/anxious and does not have insomnia.     Blood pressure 136/84, pulse 99, temperature 97.6 F (36.4 C), temperature source Oral, resp. rate 17, height 5' 8.5" (1.74 m), weight 79.833 kg (176 lb), SpO2 97 %.Body mass index is 26.37 kg/(m^2).  See Physician SRA     Past Medical History:  Past Medical History  Diagnosis Date  . Hypertension    History reviewed. No pertinent past surgical history. Family  History: History reviewed. No pertinent family history. Social History:  History  Alcohol Use  . Yes    Comment: 10- 1/5 bottles of wine     History  Drug Use  . Yes  . Special: Cocaine    History   Social History  . Marital Status: Single    Spouse Name: N/A  . Number of Children: N/A  . Years of Education: N/A   Social History Main Topics  . Smoking status: Current Every Day Smoker -- 0.50 packs/day    Types: Cigarettes  . Smokeless tobacco: Not on file  . Alcohol Use: Yes     Comment: 10- 1/5 bottles of wine  . Drug Use: Yes    Special: Cocaine  . Sexual Activity: Not on file   Other Topics Concern  . None   Social History Narrative   Risk to Self: Is patient at risk for suicide?: Yes What has been your use of drugs/alcohol within the last 12 months?: cocaine-  2-3 times a week $30-40 worth; alcohol use daily- 4-5 fifths of liquor or wine Risk to Others:   Prior Inpatient Therapy:   Prior Outpatient Therapy:    Level of Care:  OP  Hospital Course:    Robert Foster is an 49 y.o. male. Patient arrived voluntarily to [facility]. Patient reports SI. Patient denies HI. Patient reports hearing voices. According to the patient, he has been hearing voices. Patient states that he has been diagnosed with depression. Patient reports that his depression has worsened. Patient states that he plans to jump off a bridge. Patient admits to 1 previous attempt at the age of 49 y.o. Patient reports being hospitalized  2x in [place] for SI. The patient reports that he was receiving outpatient treatment at [facility] year ago. Patient admits to SA and alcohol use. Patient uses $20 of cocaine 2x a week. Patient states that he drinks 105ths of liquor daily. According to the patient, he was prescribed Remeron while he was a patient at [facility] but he has not been for treatment in 5 years. Patient admits to medication non-compliance.          Robert Foster was admitted to the adult 300 unit. He was  evaluated and his symptoms were identified. Medication management was discussed and initiated. The patient was not taking any psychotropic medications prior to his admission. The patient completed the librium detox protocol due to his prior alcohol usage. He was started on Wellbutrin XL 150 mg daily for depression along with Remeron 15 mg at bedtime to help with sleep.  He was oriented to the unit and encouraged to participate in unit programming. Medical problems were identified and treated appropriately. The patient was noted to have elevated blood pressure during his admission. He was started on the diuretic medication HCTZ as a result with positive results.  Home medication was restarted as needed.        The patient was evaluated each day by a clinical provider to ascertain the patient's response to treatment.  Improvement was noted by the patient's report of decreasing symptoms, improved sleep and appetite, affect, medication tolerance, behavior, and participation in unit programming.  He was asked each day to complete a self inventory noting mood, mental status, pain, new symptoms, anxiety and concerns.         He responded well to medication and being in a therapeutic and supportive environment. The nursing staff reported that the patient appeared depressed on the unit. He reported being off antidepressant medications for some time and verbalized understanding that they would take some time to become effective again. He talked with staff about his long term goal of going back to school.  Positive and appropriate behavior was noted and the patient was motivated for recovery.  The patient worked closely with the treatment team and case manager to develop a discharge plan with appropriate goals. Coping skills, problem solving as well as relaxation therapies were also part of the unit programming.         By the day of discharge he was in much improved condition than upon admission.  Symptoms were reported as  significantly decreased or resolved completely. The patient denied SI/HI and voiced no AVH. He was motivated to continue taking medication with a goal of continued improvement in mental health.  Robert Foster was discharged home with a plan to follow up as noted below. The patient was provided with sample medications and prescriptions at time of discharge. He left [facility] in stable condition with all belongings returned to him. The patient will discharge to the [facility]. He was provided with an [employer].   Consults:  psychiatry  Significant Diagnostic Studies:  Chemistry panel, CBC, UDS positive for benzos and cocaine   Discharge Vitals:   Blood pressure 136/84, pulse 99, temperature 97.6 F (36.4 C), temperature source Oral, resp. rate 17, height 5' 8.5" (1.74 m), weight 79.833 kg (176 lb), SpO2 97 %. Body mass index is 26.37 kg/(m^2). Lab Results:   No results found for this or any previous visit (from the past 72 hour(s)).  Physical Findings: AIMS: Facial and Oral Movements Muscles of Facial Expression: None, normal Lips  and Perioral Area: None, normal Jaw: None, normal Tongue: None, normal,Extremity Movements Upper (arms, wrists, hands, fingers): None, normal Lower (legs, knees, ankles, toes): None, normal, Trunk Movements Neck, shoulders, hips: None, normal, Overall Severity Severity of abnormal movements (highest score from questions above): None, normal Incapacitation due to abnormal movements: None, normal Patient's awareness of abnormal movements (rate only patient's report): No Awareness, Dental Status Current problems with teeth and/or dentures?: No Does patient usually wear dentures?: No  CIWA:  CIWA-Ar Total: 0 COWS:      See Psychiatric Specialty Exam and Suicide Risk Assessment completed by Attending Physician prior to discharge.  Discharge destination:  Home  Is patient on multiple antipsychotic therapies at discharge:  No   Has Patient had three or  more failed trials of antipsychotic monotherapy by history:  No  Recommended Plan for Multiple Antipsychotic Therapies: NA     Medication List    TAKE these medications      Indication   buPROPion 150 MG 24 hr tablet  Commonly known as:  WELLBUTRIN XL  Take 1 tablet (150 mg total) by mouth daily. For depression   Indication:  Major Depressive Disorder     hydrochlorothiazide 25 MG tablet  Commonly known as:  HYDRODIURIL  Take 1 tablet (25 mg total) by mouth daily.   Indication:  High Blood Pressure     mirtazapine 15 MG tablet  Commonly known as:  REMERON  Take 1 tablet (15 mg total) by mouth at bedtime. For depression and sleep   Indication:  Trouble Sleeping, Major Depressive Disorder     multivitamin with minerals Tabs tablet  Take 1 tablet by mouth daily. May purchase over the counter to improve general health.   Indication:  Vitamin Supplementation     naproxen 500 MG tablet  Commonly known as:  NAPROSYN  Take 1 tablet (500 mg total) by mouth 2 (two) times daily with a meal. As needed for pain            Follow-up Information    [facility]/2014.   Why:  Assessment for therapy and medication management services on Thursday April 28th at 1:45 pm. Please call office if you need to reschedule. Please bring letter from shelter verifying your that you are a resident and are unemployed.   Contact information:   [address] [place] [place] [address] [phone] Fax [phone]      Follow up with [facility].   Why:  Please contact for appointment for follow up of elevated blood pressure. The medicine HCTZ 25 mg daily was started.  You will need to provide a letter from the shelter stating that you are a resident and unemployed.   Contact information:   [address] [place], [place] [address] [phone]      Follow-up recommendations:   Activity: as tolerated Diet: regular Follow up  [facility] and as above  Comments:   Take all your medications as prescribed by your mental healthcare provider.  Report any adverse effects and or reactions from your medicines to your outpatient provider promptly.  Patient is instructed and cautioned to not engage in alcohol and or illegal drug use while on prescription medicines.  In the event of worsening symptoms, patient is instructed to call the crisis hotline, 911 and or go to the nearest ED for appropriate evaluation and treatment of symptoms.  Follow-up with your primary care provider for your other medical issues,  concerns and or health care needs.   Total Discharge Time: Greater than 30 minutes  Signed: DAVIS, LAURA NP-C 08/17/2014, 11:21 AM  I personally assessed the patient and formulated the plan Madie RenoIrving A. Dub MikesLugo, M.D.

## 2014-08-17 NOTE — Progress Notes (Signed)
Recreation Therapy Notes  Animal-Assisted Activity (AAA) Program Checklist/Progress Notes Patient Eligibility Criteria Checklist & Daily Group note for Rec Tx Intervention  Date: 04.26.2016 Time: 2:45pm [address]: [address]    AAA/T Program Assumption of Risk Form signed by Patient/ or Parent Legal Guardian yes  Patient is free of allergies or sever asthma yes  Patient reports no fear of animals yes  Patient reports no history of cruelty to animals yes  Patient understands his/her participation is voluntary yes  Behavioral Response: Did not attend.   Marykay Lexenise L Ponciano Shealy, LRT/CTRS  Jearl KlinefelterBlanchfield, Emerald Gehres L 08/17/2014 4:51 PM

## 2014-08-17 NOTE — [facility] Group Notes (Signed)
[facility] Group Notes:  (Nursing/MHT/Case Management/Adjunct)  Date:  08/17/2014  Time:  0915am  Type of Therapy:  Nurse Education  Participation Level:  Did Not Attend  Participation Quality:  Did not attend  Affect:  Did not attend  Cognitive:  Did not attend  Insight:  None  Engagement in Group:  Did not attend  Modes of Intervention:  Discussion, Education and Support  Summary of Progress/Problems: Patient was invited to group. Pt did not attend and remained in bed resting.   Lendell CapriceGuthrie, Robert Meints A 08/17/2014, 12:33 PM

## 2014-08-19 NOTE — Progress Notes (Signed)
Patient Discharge Instructions:  After Visit Summary (AVS):   Faxed to:  08/19/14 Psychiatric Admission Assessment Note:   Faxed to:  08/19/14 Suicide Risk Assessment - Discharge Assessment:   Faxed to:  08/19/14 Faxed/Sent to the Next Level Care provider:  08/19/14 Faxed to [facility] @ [phone] [facility] @ [phone]  Jerelene ReddenSheena E Conde, 08/19/2014, 3:15 PM

## 2020-06-24 ENCOUNTER — Ambulatory Visit: Payer: Self-pay | Admitting: Nurse Practitioner

## 2020-06-24 ENCOUNTER — Other Ambulatory Visit: Payer: Self-pay

## 2020-06-24 ENCOUNTER — Encounter (HOSPITAL_COMMUNITY): Payer: Self-pay

## 2020-06-24 ENCOUNTER — Emergency Department (HOSPITAL_COMMUNITY)
Admission: EM | Admit: 2020-06-24 | Discharge: 2020-06-24 | Disposition: A | Discharge status: Home / Self Care | Payer: Commercial Managed Care - HMO | Attending: Emergency Medicine | Admitting: Emergency Medicine

## 2020-06-24 NOTE — ED Triage Notes (Signed)
Per [facility] , pt coming from hotel reporting chest pain starting at 1800 today, states has now resolved, HR 120, 324MG  ASPIRIN. EKG unremarkable, hx htn and noncompliant with medications. bp 140/90

## 2020-06-24 NOTE — ED Notes (Signed)
Pt reports he does not want to wait for provider states he is fine and is going home, "I dont even know why they brought me in here, my chest pain is gone, ive gotta go".

## 2020-06-25 ENCOUNTER — Ambulatory Visit (HOSPITAL_COMMUNITY)
Admission: EM | Admit: 2020-06-25 | Discharge: 2020-06-27 | Disposition: A | Discharge status: Home / Self Care | Payer: Commercial Managed Care - HMO | Attending: Psychiatry | Admitting: Psychiatry

## 2020-06-25 DIAGNOSIS — F332 Major depressive disorder, recurrent severe without psychotic features: Secondary | ICD-10-CM | POA: Diagnosis not present

## 2020-06-25 DIAGNOSIS — F1410 Cocaine abuse, uncomplicated: Secondary | ICD-10-CM

## 2020-06-25 DIAGNOSIS — F322 Major depressive disorder, single episode, severe without psychotic features: Secondary | ICD-10-CM

## 2020-06-25 DIAGNOSIS — Z20822 Contact with and (suspected) exposure to covid-19: Secondary | ICD-10-CM | POA: Diagnosis not present

## 2020-06-25 DIAGNOSIS — R45851 Suicidal ideations: Secondary | ICD-10-CM | POA: Diagnosis present

## 2020-06-25 LAB — RESP PANEL BY RT-PCR (FLU A&B, COVID) ARPGX2
Influenza A by PCR: NEGATIVE
Influenza B by PCR: NEGATIVE
SARS Coronavirus 2 by RT PCR: NEGATIVE

## 2020-06-25 LAB — POC SARS CORONAVIRUS 2 AG -  ED: SARS Coronavirus 2 Ag: NEGATIVE

## 2020-06-25 MED ORDER — TRAZODONE HCL 50 MG PO TABS
50.0000 mg | ORAL_TABLET | Freq: Every evening | ORAL | Status: DC | PRN
  Administered 2020-06-25 – 2020-06-26 (×2): 50 mg via ORAL
  Filled 2020-06-25 (×2): qty 1

## 2020-06-25 NOTE — BH Assessment (Addendum)
TTS Triage: [facility] to [facility] via [facility] due to worsening depression, suicidal ideation with plan to jump off a bridge and etoh use of 2 cases of beer within last two days. Pt recently moved here from  and is now homeless. Pt denies HI/AVH.  Pt is emergent

## 2020-06-25 NOTE — BH Assessment (Signed)
Comprehensive Clinical Assessment (CCA) Note  06/25/2020 Robert BoozeSteven Foster [id number]  Per [name]-Leevy-Johnson, NP, patient is recommended for overnight observation.   Robert Foster is a 55 year old male [facility] due to worsening depression, suicidal ideation with plan to jump off a bridge. Patient reports smoking crack yesterday and etoh use today. Patient state that he moved here from [place] about two months ago and is now homeless living in hotels and with "acquaintances". Pt denies HI/AVH or SIB. Patient reports that he is experiencing worsening depression because he has been without his medications since coming to [place]. Patient reports taking Wellbutrin but does not know the dosage. Patient does not have any outpatient services currently and per chart review patient [facility] with similar circumstances and was recommended for inpatient treatment. Patient is not working and does not have SSI benefits. Patient denies having legal charges. Patient denies having any supportive individuals in the area and reports a protective factor of having access to his medication which he is out of currently. When asked what would help patient the most, he reports getting his medications and going to detox or some type of substance abuse program or sober living house.   The patient demonstrates the following risk factors for suicide: Chronic risk factors for suicide include: substance use disorder. Acute risk factors for suicide include: unemployment and loss (financial, interpersonal, professional). Protective factors for this patient include: medications. Considering these factors, the overall suicide risk at this point appears to be at moderate risk. Patient is not appropriate for outpatient follow up.  Chief Complaint:  Chief Complaint  Patient presents with  . Depression  . Suicidal   Visit Diagnosis: Substance induced mood disorder    CCA  Screening, Triage and Referral (STR)  Patient Reported Information How did you hear about us? No data recorded Referral name: No data recorded Referral phone number: No data recorded  Whom do you see for routine medical problems? No data recorded Practice/Facility Name: No data recorded Practice/Facility Phone Number: No data recorded Name of Contact: No data recorded Contact Number: No data recorded Contact Fax Number: No data recorded Prescriber Name: No data recorded Prescriber Address (if known): No data recorded  What Is the Reason for Your Visit/Call Today? No data recorded How Long Has This Been Causing You Problems? No data recorded What Do You Feel Would Help You the Most Today? No data recorded  Have You Recently Been in Any Inpatient Treatment (Hospital/Detox/Crisis Center/28-Day Program)? No data recorded Name/Location of Program/Hospital:No data recorded How Long Were You There? No data recorded When Were You Discharged? No data recorded  Have You Ever Received Services From [facility] Before? No data recorded Who Do You [facility]? No data recorded  Have You Recently Had Any Thoughts About Hurting Yourself? No data recorded Are You Planning to Commit Suicide/Harm Yourself At This time? No data recorded  Have you Recently Had Thoughts About Hurting Someone Karolee Ohslse? No data recorded Explanation: No data recorded  Have You Used Any Alcohol or Drugs in the Past 24 Hours? No data recorded How Long Ago Did You Use Drugs or Alcohol? No data recorded What Did You Use and How Much? No data recorded  Do You Currently Have a Therapist/Psychiatrist? No data recorded Name of Therapist/Psychiatrist: No data recorded  Have You Been Recently Discharged From Any Office Practice or Programs? No data recorded Explanation of Discharge From Practice/Program: No data recorded    CCA  Screening Triage Referral Assessment Type of Contact: No data recorded Is this Initial or  Reassessment? No data recorded Date Telepsych consult ordered in [facility]:  No data recorded Time Telepsych consult ordered in [facility]:  No data recorded  Patient Reported Information Reviewed? No data recorded Patient Left Without Being Seen? No data recorded Reason for Not Completing Assessment: No data recorded  Collateral Involvement: No data recorded  Does Patient Have a Court Appointed Legal Guardian? No data recorded Name and Contact of Legal Guardian: No data recorded If Minor and Not Living with Parent(s), Who has Custody? No data recorded Is CPS involved or ever been involved? No data recorded Is APS involved or ever been involved? No data recorded  Patient Determined To Be At Risk for Harm To Self or Others Based on Review of Patient Reported Information or Presenting Complaint? No data recorded Method: No data recorded Availability of Means: No data recorded Intent: No data recorded Notification Required: No data recorded Additional Information for Danger to Others Potential: No data recorded Additional Comments for Danger to Others Potential: No data recorded Are There Guns or Other Weapons in Your Home? No data recorded Types of Guns/Weapons: No data recorded Are These Weapons Safely Secured?                            No data recorded Who Could Verify You Are Able To Have These Secured: No data recorded Do You Have any Outstanding Charges, Pending Court Dates, Parole/Probation? No data recorded Contacted To Inform of Risk of Harm To Self or Others: No data recorded  Location of Assessment: No data recorded  Does Patient Present under Involuntary Commitment? No data recorded IVC Papers Initial File Date: No data recorded  [place] of Residence: No data recorded  Patient Currently Receiving the Following Services: No data recorded  Determination of Need: No data recorded  Options For Referral: No data recorded    CCA Biopsychosocial Intake/Chief Complaint:  Depression,  SI  Current Symptoms/Problems: substance use, feeling hopeless, suicidal   Patient Reported Schizophrenia/Schizoaffective Diagnosis in Past: No   Strengths: UTA  Preferences: UTA  Abilities: UTA   Type of Services Patient Feels are Needed: Tourist information centre manager and SA program   Initial Clinical Notes/Concerns: No data recorded  Mental Health Symptoms Depression:  Hopelessness; Change in energy/activity   Duration of Depressive symptoms: Greater than two weeks   Mania:  None   Anxiety:   None   Psychosis:  None   Duration of Psychotic symptoms: No data recorded  Trauma:  None   Obsessions:  None   Compulsions:  None   Inattention:  None   Hyperactivity/Impulsivity:  N/A   Oppositional/Defiant Behaviors:  None   Emotional Irregularity:  None   Other Mood/Personality Symptoms:  No data recorded   Mental Status Exam Appearance and self-care  Stature:  Average   Weight:  Average weight   Clothing:  Age-appropriate   Grooming:  Neglected   Cosmetic use:  None   Posture/gait:  Normal   Motor activity:  Not Remarkable   Sensorium  Attention:  Normal   Concentration:  Normal   Orientation:  Person; Place; Situation   Recall/memory:  Normal   Affect and Mood  Affect:  Appropriate   Mood:  Euthymic   Relating  Eye contact:  Normal   Facial expression:  Responsive   Attitude toward examiner:  Cooperative   Thought and Language  Speech flow:  Clear and Coherent   Thought content:  Appropriate to Mood and Circumstances   Preoccupation:  None   Hallucinations:  None   Organization:  No data recorded  [employer] of Knowledge:  Fair   Intelligence:  Average   Abstraction:  Normal   Judgement:  Fair   Dance movement psychotherapist:  Adequate   Insight:  Fair   Decision Making:  Normal   Social Functioning  Social Maturity:  Irresponsible   Social Judgement:  "Street Smart"   Stress  Stressors:  Housing   Coping  Ability:  Exhausted; Deficient supports   Skill Deficits:  None   Supports:  Support needed     Religion:    Leisure/Recreation:    Exercise/Diet: Exercise/Diet Have You Gained or Lost A Significant Amount of Weight in the Past Six Months?: Yes-Lost   CCA Employment/Education Employment/Work Situation: Employment / Work Situation Employment situation: Unemployed Patient's job has been impacted by current illness: No What is the longest time patient has a held a job?: 3 years Where was the patient employed at that time?: Restaurant  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 )  Education:     CCA Family/Childhood History Family and Relationship History: Family history What is your sexual orientation?: UTA Has your sexual activity been affected by drugs, alcohol, medication, or emotional stress?: UTA Does patient have children?: No  Childhood History:  Childhood History By whom was/is the patient raised?: Mother Additional childhood history information: UTA Description of patient's relationship with caregiver when they were a child: Didn't get along with mother as a child Patient's description of current relationship with people who raised him/her: UTA How were you disciplined when you got in trouble as a child/adolescent?: UTA Did patient suffer any verbal/emotional/physical/sexual abuse as a child?: No Has patient ever been sexually abused/assaulted/raped as an adolescent or adult?: No Witnessed domestic violence?: No Has patient been affected by domestic violence as an adult?: No  Child/Adolescent Assessment:     CCA Substance Use Alcohol/Drug Use: Alcohol / Drug Use Pain Medications: Pt denies Prescriptions: Pt denies Over the Counter: Pt denies History of alcohol / drug use?: Yes Longest period of sobriety (when/how long): Pt states unknown Negative Consequences of Use: Financial,Legal,Personal  relationships,Work / School Substance #1 Name of Substance 1: ETOH 1 - Duration: ongoing 1 - Last Use / Amount: 06/25/2020 1- Route of Use: oral Substance #2 Name of Substance 2: Cocaine 2 - Duration: ongoing 2 - Last Use / Amount: 06/24/2020 2 - Route of Substance Use: Smoking                     ASAM's:  Six Dimensions of Multidimensional Assessment  Dimension 1:  Acute Intoxication and/or Withdrawal Potential:      Dimension 2:  Biomedical Conditions and Complications:      Dimension 3:  Emotional, Behavioral, or Cognitive Conditions and Complications:     Dimension 4:  Readiness to Change:     Dimension 5:  Relapse, Continued use, or Continued Problem Potential:     Dimension 6:  Recovery/Living Environment:     ASAM Severity Score:    ASAM Recommended Level of Treatment:     Substance use Disorder (SUD) Substance Use Disorder (SUD)  Checklist Symptoms of Substance Use: Recurrent use that results in a failure to fulfill major role obligations (work, school, home),Persistent desire or unsuccessful efforts to cut down or control use,Continued use despite  persistent or recurrent social, interpersonal problems, caused or exacerbated by use,Continued use despite having a persistent/recurrent physical/psychological problem caused/exacerbated by use  Recommendations for Services/Supports/Treatments: Recommendations for Services/Supports/Treatments Recommendations For Services/Supports/Treatments: Detox,CD-IOP Intensive Chemical Dependency Program  DSM5 Diagnoses: Patient Active Problem List   Diagnosis Date Noted  . Alcohol dependence with uncomplicated withdrawal (HCC)   . Alcohol dependence (HCC) 08/12/2014  . Cocaine abuse (HCC) 08/12/2014  . Substance abuse (HCC) 08/11/2014  . Severe recurrent major depression with psychotic features (HCC) 08/10/2014    Per [name]-Leevy-Johnson, NP, patient is recommended for overnight observation.   Robert Foster Shirlee More, [facility]

## 2020-06-25 NOTE — ED Notes (Signed)
Pt alert and oriented during [facility] admission process. Pt denies HI, VH, and any pain. Pt is cooperative. Education, support, reassurance, and encouragement provided. Pt denies any concerns at this time, and verbally contracts for safety. Pt ambulating on the unit with no issues. Pt remains safe on the unit.

## 2020-06-25 NOTE — ED Notes (Signed)
Pt A&O x 4, sleeping at present, no distress noted,  Monitoring for safety. 

## 2020-06-25 NOTE — ED Notes (Signed)
Pt belongings in pink locker.

## 2020-06-25 NOTE — ED Notes (Signed)
Pt asleep with even and unlabored respirations. No distress or discomfort noted. Pt remains safe on the unit. Will continue to monitor. 

## 2020-06-25 NOTE — ED Provider Notes (Signed)
Behavioral Health Admission H&P [facility] & OBS)  Date: 06/25/20 Patient Name: Robert Foster MRN: [id number] Chief Complaint:  Chief Complaint  Patient presents with  . Depression  . Suicidal   Chief Complaint/Presenting Problem: Depression, SI  Diagnoses:  Final diagnoses:  None   HPI:  Robert Foster is a 55 year old male who presented to [facility] voluntarily as a walk-in for evaluation of suicidal ideations with intent and plan. Patient states he had a plan to go to the [facility] bridge and jump. Patient has past history of polysubstance abuse (alcohol, cocaine), suicidal ideations, and self-reported depression. Patient presented to [facility] 06/24/20 for chest pain 2/2 to smoking crack. Patient reports last crack cocaine use as "last night" and alcohol "this morning". Patient is currently unemployed and homeless with no outpatient resources.   Patient currently endorses suicidal ideations with plan to jump off bridge.  Denies any homicidal ideations, auditory or visual hallucinations; does not appear to be responding to any external/internal stimuli at this time.  Patient states he is unable to contract for safety at this time. Patient to remain overnight for further observation, stabilization, and treatment.   PHQ 2-9:   Flowsheet Row ED from 06/25/2020 in [facility] ED from 06/24/2020 in [facility] EMERGENCY DEPARTMENT  C-SSRS RISK CATEGORY High Risk No Risk       Total Time spent with patient: 20 minutes  Musculoskeletal  Strength & Muscle Tone: within normal limits Gait & Station: normal Patient leans: N/A  Psychiatric Specialty Exam  Presentation General Appearance: No data recorded Eye Contact:No data recorded Speech:No data recorded Speech Volume:No data recorded Handedness:No data recorded  Mood and Affect  Mood:No data recorded Affect:No data recorded  Thought Process  Thought Processes:No data recorded Descriptions of  Associations:No data recorded Orientation:No data recorded Thought Content:No data recorded  Hallucinations:No data recorded Ideas of Reference:No data recorded Suicidal Thoughts:No data recorded Homicidal Thoughts:No data recorded  Sensorium  Memory:No data recorded Judgment:No data recorded Insight:No data recorded  Executive Functions  Concentration:No data recorded Attention Span:No data recorded Recall:No data recorded Fund of Knowledge:No data recorded Language:No data recorded  Psychomotor Activity  Psychomotor Activity:No data recorded  Assets  Assets:No data recorded  Sleep  Sleep:No data recorded  No data recorded  Physical Exam Vitals and nursing note reviewed.  Psychiatric:        Attention and Perception: Attention and perception normal.        Mood and Affect: Affect is flat.        Behavior: Behavior is slowed and withdrawn. Behavior is cooperative.        Thought Content: Thought content includes suicidal ideation. Thought content includes suicidal plan.        Judgment: Judgment is impulsive.    Review of Systems  Psychiatric/Behavioral: Positive for depression, substance abuse and suicidal ideas.    Blood pressure 125/90, pulse 94, temperature 99.3 F (37.4 C), temperature source Oral, resp. rate 16, SpO2 96 %. There is no height or weight on file to calculate BMI.  Past Psychiatric History:   -Polysubstance abuse   -Alcohol    -Cocaine  -Suicidal ideation  Is the patient at risk to self? Yes  Has the patient been a risk to self in the past 6 months? Yes .    Has the patient been a risk to self within the distant past? Yes   Is the patient a risk to others? patient denies  Has the patient  been a risk to others in the past 6 months? patient denies  Has the patient been a risk to others within the distant past? patient denies  Past Medical History:  Past Medical History:  Diagnosis Date  . Hypertension    No past surgical history on  file.  Family History: No family history on file.  Social History:  Social History   Socioeconomic History  . Marital status: Single    Spouse name: Not on file  . Number of children: Not on file  . Years of education: Not on file  . Highest education level: Not on file  Occupational History  . Not on file  Tobacco Use  . Smoking status: Current Every Day Smoker    Packs/day: 0.50    Types: Cigarettes  . Smokeless tobacco: Not on file  Substance and Sexual Activity  . Alcohol use: Yes    Comment: 10- 1/5 bottles of wine  . Drug use: Not Currently    Types: Cocaine  . Sexual activity: Not on file  Other Topics Concern  . Not on file  Social History Narrative  . Not on file   Social Determinants of Health   Financial Resource Strain: Not on file  Food Insecurity: Not on file  Transportation Needs: Not on file  Physical Activity: Not on file  Stress: Not on file  Social Connections: Not on file  Intimate Partner Violence: Not on file    SDOH:  SDOH Screenings   Alcohol Screen: Not on file  Depression (NTI1-4): Not on file  Financial Resource Strain: Not on file  Food Insecurity: Not on file  Housing: Not on file  Physical Activity: Not on file  Social Connections: Not on file  Stress: Not on file  Tobacco Use: High Risk  . Smoking Tobacco Use: Current Every Day Smoker  . Smokeless Tobacco Use: Unknown  Transportation Needs: Not on file   Last Labs:  Admission on 06/25/2020  Component Date Value Ref Range Status  . SARS Coronavirus 2 Ag 06/25/2020 Negative  Negative Final   Allergies: Patient has no known allergies.  PTA Medications: (Not in a hospital admission)  Medical Decision Making  -Patient to remain overnight for further observation and stabilization; will be re-evaluated by psychiatry in the morning.     Recommendations  Based on my evaluation the patient does not appear to have an emergency medical condition.  Loletta Parish,  NP 06/25/20  4:55 PM

## 2020-06-26 DIAGNOSIS — F1410 Cocaine abuse, uncomplicated: Secondary | ICD-10-CM

## 2020-06-26 DIAGNOSIS — F322 Major depressive disorder, single episode, severe without psychotic features: Secondary | ICD-10-CM | POA: Diagnosis not present

## 2020-06-26 LAB — POCT URINE DRUG SCREEN - MANUAL ENTRY (I-SCREEN)
POC Amphetamine UR: NOT DETECTED
POC Buprenorphine (BUP): NOT DETECTED
POC Cocaine UR: POSITIVE — AB
POC Marijuana UR: NOT DETECTED
POC Methadone UR: NOT DETECTED
POC Methamphetamine UR: NOT DETECTED
POC Morphine: NOT DETECTED
POC Oxazepam (BZO): NOT DETECTED
POC Oxycodone UR: NOT DETECTED
POC Secobarbital (BAR): NOT DETECTED

## 2020-06-26 MED ORDER — BUPROPION HCL ER (XL) 150 MG PO TB24
150.0000 mg | ORAL_TABLET | Freq: Every day | ORAL | Status: DC
  Administered 2020-06-26 – 2020-06-27 (×2): 150 mg via ORAL
  Filled 2020-06-26 (×2): qty 1

## 2020-06-26 MED ORDER — HYDROCHLOROTHIAZIDE 25 MG PO TABS
25.0000 mg | ORAL_TABLET | Freq: Every day | ORAL | Status: DC
Start: 2020-06-26 — End: 2020-06-27
  Administered 2020-06-26 – 2020-06-27 (×2): 25 mg via ORAL
  Filled 2020-06-26 (×2): qty 1

## 2020-06-26 NOTE — ED Notes (Signed)
Pt alert and oriented on the unit.Pt endorsing SI and denies HI and A/VH. Pt denied any pain, and is pleasant and cooperative on the unit. Education, support, and encouragement provided. Pt denies any concerns at this time and verbally contracts for safety. Pt ambulating on the unit with no issues. Pt remains safe on the unit.

## 2020-06-26 NOTE — ED Notes (Signed)
Pt asleep with even and unlabored respirations. No distress or discomfort noted. Pt remains safe on the unit. Will continue to monitor. 

## 2020-06-26 NOTE — ED Provider Notes (Signed)
Behavioral Health Progress Note  Date and Time: 06/26/2020 2:09 PM Name: Robert BoozeSteven Foster MRN:  [id number]  Subjective:   "I'm so so. I'm still having suicidal thoughts and hearing the voices. They're telling me to do it"  Patient presents flat and withdrawn; minimal engagement in assessment. He continues to endorse command hallucinations and suicidal thoughts with plan and intent to jump off a bridge. Patient states "this morning" as his last time hearing voices.  Patient denies any homicidal ideations or visual hallucinations. Patient states he unable to contract for safety at this time. Medications restarted for depression and hypertension per patient self-report and home medication list. Patient reviewed with Dr Jannifer FranklinAkintayo; questionable secondary gain. Patient to remain overnight for further observation, stabilization, and treatment; will be reassessed in the morning.   Diagnosis:  Final diagnoses:  None    Total Time spent with patient: 20 minutes  Past Psychiatric History:  Past Medical History:  Past Medical History:  Diagnosis Date  . Hypertension    No past surgical history on file. Family History: No family history on file. Family Psychiatric History: not noted Social History:  Social History   Substance and Sexual Activity  Alcohol Use Yes   Comment: 10- 1/5 bottles of wine     Social History   Substance and Sexual Activity  Drug Use Not Currently  . Types: Cocaine    Social History   Socioeconomic History  . Marital status: Single    Spouse name: Not on file  . Number of children: Not on file  . Years of education: Not on file  . Highest education level: Not on file  Occupational History  . Not on file  Tobacco Use  . Smoking status: Current Every Day Smoker    Packs/day: 0.50    Types: Cigarettes  . Smokeless tobacco: Not on file  Substance and Sexual Activity  . Alcohol use: Yes    Comment: 10- 1/5 bottles of wine  . Drug use: Not Currently    Types:  Cocaine  . Sexual activity: Not on file  Other Topics Concern  . Not on file  Social History Narrative  . Not on file   Social Determinants of Health   Financial Resource Strain: Not on file  Food Insecurity: Not on file  Transportation Needs: Not on file  Physical Activity: Not on file  Stress: Not on file  Social Connections: Not on file   SDOH:  SDOH Screenings   Alcohol Screen: Not on file  Depression (WUJ8-1(PHQ2-9): Not on file  Financial Resource Strain: Not on file  Food Insecurity: Not on file  Housing: Not on file  Physical Activity: Not on file  Social Connections: Not on file  Stress: Not on file  Tobacco Use: High Risk  . Smoking Tobacco Use: Current Every Day Smoker  . Smokeless Tobacco Use: Unknown  Transportation Needs: Not on file   Additional Social History:    Pain Medications: Pt denies Prescriptions: Pt denies Over the Counter: Pt denies History of alcohol / drug use?: Yes Longest period of sobriety (when/how long): Pt states unknown Negative Consequences of Use: Financial,Legal,Personal relationships,Work / School Name of Substance 1: ETOH 1 - Duration: ongoing 1 - Last Use / Amount: 06/25/2020 1- Route of Use: oral Name of Substance 2: Cocaine 2 - Duration: ongoing 2 - Last Use / Amount: 06/24/2020 2 - Route of Substance Use: Smoking   Sleep: Fair  Appetite:  Fair  Current Medications:  Current Facility-Administered Medications  Medication Dose Route Frequency Provider Last Rate Last Admin  . traZODone (DESYREL) tablet 50 mg  50 mg Oral QHS PRN Jaclyn Shaggy, PA-C   50 mg at 06/25/20 2145   Current Outpatient Medications  Medication Sig Dispense Refill  . amLODipine (NORVASC) 10 MG tablet Take 10 mg by mouth daily.    . DULoxetine (CYMBALTA) 60 MG capsule Take 60 mg by mouth daily.    Marland Kitchen lisinopril (ZESTRIL) 5 MG tablet Take 5 mg by mouth daily.    . sertraline (ZOLOFT) 100 MG tablet Take 100 mg by mouth daily.    Marland Kitchen buPROPion (WELLBUTRIN XL)  150 MG 24 hr tablet Take 1 tablet (150 mg total) by mouth daily. For depression 30 tablet 0  . hydrochlorothiazide (HYDRODIURIL) 25 MG tablet Take 1 tablet (25 mg total) by mouth daily. (Patient not taking: Reported on 06/25/2020) 30 tablet 0  . mirtazapine (REMERON) 15 MG tablet Take 1 tablet (15 mg total) by mouth at bedtime. For depression and sleep (Patient not taking: Reported on 06/25/2020) 30 tablet 0  . Multiple Vitamin (MULTIVITAMIN WITH MINERALS) TABS tablet Take 1 tablet by mouth daily. May purchase over the counter to improve general health. (Patient not taking: Reported on 06/25/2020)    . naproxen (NAPROSYN) 500 MG tablet Take 1 tablet (500 mg total) by mouth 2 (two) times daily with a meal. As needed for pain (Patient not taking: Reported on 06/25/2020) 20 tablet 0    Labs  Lab Results:  Admission on 06/25/2020  Component Date Value Ref Range Status  . SARS Coronavirus 2 by RT PCR 06/25/2020 NEGATIVE  NEGATIVE Final   Comment: (NOTE) SARS-CoV-2 target nucleic acids are NOT DETECTED.  The SARS-CoV-2 RNA is generally detectable in upper respiratory specimens during the acute phase of infection. The lowest concentration of SARS-CoV-2 viral copies this assay can detect is 138 copies/mL. A negative result does not preclude SARS-Cov-2 infection and should not be used as the sole basis for treatment or other patient management decisions. A negative result may occur with  improper specimen collection/handling, submission of specimen other than nasopharyngeal swab, presence of viral mutation(s) within the areas targeted by this assay, and inadequate number of viral copies(<138 copies/mL). A negative result must be combined with clinical observations, patient history, and epidemiological information. The expected result is Negative.  Fact Sheet for Patients:  BloggerCourse.com  Fact Sheet for Healthcare Providers:  SeriousBroker.it  This  test is no                          t yet approved or cleared by the [place] FDA and  has been authorized for detection and/or diagnosis of SARS-CoV-2 by FDA under an Emergency Use Authorization (EUA). This EUA will remain  in effect (meaning this test can be used) for the duration of the COVID-19 declaration under Section 564(b)(1) of the Act, 21 U.S.C.section 360bbb-3(b)(1), unless the authorization is terminated  or revoked sooner.      . Influenza A by PCR 06/25/2020 NEGATIVE  NEGATIVE Final  . Influenza B by PCR 06/25/2020 NEGATIVE  NEGATIVE Final   Comment: (NOTE) The Xpert Xpress SARS-CoV-2/FLU/RSV plus assay is intended as an aid in the diagnosis of influenza from Nasopharyngeal swab specimens and should not be used as a sole basis for treatment. Nasal washings and aspirates are unacceptable for Xpert Xpress SARS-CoV-2/FLU/RSV testing.  Fact Sheet for Patients: BloggerCourse.com  Fact Sheet for Healthcare Providers: SeriousBroker.it  This test is not yet approved or cleared by the [place] and has been authorized for detection and/or diagnosis of SARS-CoV-2 by FDA under an Emergency Use Authorization (EUA). This EUA will remain in effect (meaning this test can be used) for the duration of the COVID-19 declaration under Section 564(b)(1) of the Act, 21 U.S.C. section 360bbb-3(b)(1), unless the authorization is terminated or revoked.  Performed at [facility], [address] [place], [place] [address]   . SARS Coronavirus 2 Ag 06/25/2020 Negative  Negative Final  . POC Amphetamine UR 06/26/2020 None Detected  NONE DETECTED (Cut Off Level 1000 ng/mL) Final  . POC Secobarbital (BAR) 06/26/2020 None Detected  NONE DETECTED (Cut Off Level 300 ng/mL) Final  . POC Buprenorphine (BUP) 06/26/2020 None Detected  NONE DETECTED (Cut Off Level 10 ng/mL) Final  . POC Oxazepam (BZO) 06/26/2020 None Detected  NONE  DETECTED (Cut Off Level 300 ng/mL) Final  . POC Cocaine UR 06/26/2020 Positive* NONE DETECTED (Cut Off Level 300 ng/mL) Final  . POC Methamphetamine UR 06/26/2020 None Detected  NONE DETECTED (Cut Off Level 1000 ng/mL) Final  . POC Morphine 06/26/2020 None Detected  NONE DETECTED (Cut Off Level 300 ng/mL) Final  . POC Oxycodone UR 06/26/2020 None Detected  NONE DETECTED (Cut Off Level 100 ng/mL) Final  . POC Methadone UR 06/26/2020 None Detected  NONE DETECTED (Cut Off Level 300 ng/mL) Final  . POC Marijuana UR 06/26/2020 None Detected  NONE DETECTED (Cut Off Level 50 ng/mL) Final    Blood Alcohol level:  Lab Results  Component Value Date   ETH <5 08/09/2014   ETH (H) 06/16/2009    11        LOWEST DETECTABLE LIMIT FOR SERUM ALCOHOL IS 5 mg/dL FOR MEDICAL PURPOSES ONLY    Metabolic Disorder Labs: No results found for: HGBA1C, MPG No results found for: PROLACTIN No results found for: CHOL, TRIG, HDL, CHOLHDL, VLDL, LDLCALC  Therapeutic Lab Levels: No results found for: LITHIUM No results found for: VALPROATE No components found for:  CBMZ  Physical Findings   AIMS   Flowsheet Row [facility]  AIMS Total Score 0    AUDIT   Flowsheet Row [facility]  Alcohol Use Disorder Identification Test Final Score (AUDIT) 15    Flowsheet Row ED from 06/25/2020 in [facility] ED from 06/24/2020 in [facility] EMERGENCY DEPARTMENT  C-SSRS RISK CATEGORY High Risk No Risk       Musculoskeletal  Strength & Muscle Tone: within normal limits Gait & Station: normal Patient leans: N/A  Psychiatric Specialty Exam  Presentation  General Appearance: Appropriate for Environment  Eye Contact:Fair  Speech:Clear and Coherent  Speech Volume:Normal  Handedness:Left   Mood and Affect  Mood:--  (congruent)  Affect:Congruent   Thought Process  Thought Processes:Goal Directed  Descriptions of Associations:Intact  Orientation:Full (Time, Place and Person)  Thought Content:No data recorded Hallucinations:Hallucinations: None  Ideas of Reference:None  Suicidal Thoughts:Suicidal Thoughts: Yes, Passive (chronic) SI Passive Intent and/or Plan: Without Intent; Without Plan  Homicidal Thoughts:Homicidal Thoughts: No  Sensorium  Memory:Immediate Fair; Recent Fair; Remote Fair  Judgment:Intact  Insight:Present  Executive Functions  Concentration:Fair  Attention Span:Fair  Recall:Fair  Fund of Knowledge:Fair  Language:Fair  Psychomotor Activity  Psychomotor Activity:Psychomotor Activity: Normal  Assets  Assets:Resilience  Sleep  Sleep:Sleep: Good Number of Hours of Sleep:  19  Nutritional Assessment (For OBS and FBC admissions only) Has the patient had a weight loss or gain of 10 pounds or more in the last 3 months?: No Has the patient had a decrease in food intake/or appetite?: No Does the patient have dental problems?: Yes Does the patient have eating habits or behaviors that may be indicators of an eating disorder including binging or inducing vomiting?: No Has the patient recently lost weight without trying?: No Has the patient been eating poorly because of a decreased appetite?: No Malnutrition Screening Tool Score: 0   Physical Exam  Physical Exam Psychiatric:        Attention and Perception: He perceives auditory hallucinations.        Mood and Affect: Affect is flat.        Speech: Speech normal.        Behavior: Behavior is slowed. Behavior is cooperative.        Thought Content: Thought content includes suicidal ideation. Thought content includes suicidal plan.        Cognition and Memory: Cognition and memory normal.        Judgment: Judgment is impulsive.    Review of Systems  Psychiatric/Behavioral: Positive for substance abuse and  suicidal ideas.  All other systems reviewed and are negative.  Blood pressure (!) 139/102, pulse 87, temperature 98.9 F (37.2 C), temperature source Temporal, resp. rate 16, SpO2 97 %. There is no height or weight on file to calculate BMI.  Treatment Plan Summary: Restart Medications:   -Buproprion 150mg  daily  -HCTZ 25mg  daily  Daily contact with patient to assess and evaluate symptoms and progress in treatment, Medication management and Plan to re-evaluate patient by psychiatry in the morning.   , NP 06/26/2020 2:09 PM

## 2020-06-26 NOTE — ED Notes (Signed)
Pt A&O x 4, no distress noted, watching TV at present, calm & cooperative.  Monitoring for safety.

## 2020-06-26 NOTE — ED Notes (Addendum)
Pt sleeping@this  time. Breathing even and unlabored. Will continue to monitor of safety

## 2020-06-26 NOTE — Progress Notes (Signed)
Per Elijah Birk, NP pt. To be re-assessed on this day with a possible discharge. Provider requested LCSW-A to coordinate resources that would meet housing/substance use needs. LCSW-A reached out to [facility] (Contact person: Winferd Humphrey, (Phone: [phone]) to inquire about bed availability. It was confirmed that there is bed availability at the facility. LCSW-A coordinated a phone interview for placement. After phone interview, followed up with Winferd Humphrey who reported that  pt was non compliant with completing the phone interview. Based on this situation  Mr. Wallace Cullens reported that patient would not be appropriate for the program. LCSW-A also contacted [facility] in [place], spoke to [place],  and beds are available.    Provided updates to Maxie Barb, NP and she indicated that patient will remain overnight. He will be revaluated in the am with a potential discharge. LCSW to follow up with discharge planning 06/27/20.

## 2020-06-26 NOTE — ED Notes (Signed)
Pt sleeping at present, no distress noted, monitoring for safety. 

## 2020-06-27 ENCOUNTER — Telehealth (HOSPITAL_COMMUNITY): Payer: Self-pay | Admitting: Psychiatry

## 2020-06-27 DIAGNOSIS — F322 Major depressive disorder, single episode, severe without psychotic features: Secondary | ICD-10-CM | POA: Diagnosis not present

## 2020-06-27 DIAGNOSIS — F1410 Cocaine abuse, uncomplicated: Secondary | ICD-10-CM | POA: Diagnosis not present

## 2020-06-27 MED ORDER — HYDROCHLOROTHIAZIDE 25 MG PO TABS: 25.0000 mg | ORAL_TABLET | Freq: Every day | ORAL | 0 refills | Status: DC

## 2020-06-27 MED ORDER — AMLODIPINE BESYLATE 10 MG PO TABS: 10.0000 mg | ORAL_TABLET | Freq: Every day | ORAL | 0 refills | Status: AC

## 2020-06-27 MED ORDER — LISINOPRIL 5 MG PO TABS
5.0000 mg | ORAL_TABLET | Freq: Every day | ORAL | Status: DC
  Administered 2020-06-27: 5 mg via ORAL
  Filled 2020-06-27: qty 1

## 2020-06-27 MED ORDER — BUPROPION HCL ER (XL) 150 MG PO TB24: 150.0000 mg | ORAL_TABLET | Freq: Every day | ORAL | 0 refills | Status: AC

## 2020-06-27 MED ORDER — LISINOPRIL 5 MG PO TABS: 5.0000 mg | ORAL_TABLET | Freq: Every day | ORAL | 0 refills | Status: AC

## 2020-06-27 MED ORDER — AMLODIPINE BESYLATE 10 MG PO TABS
10.0000 mg | ORAL_TABLET | Freq: Every day | ORAL | Status: DC
  Administered 2020-06-27: 10 mg via ORAL
  Filled 2020-06-27: qty 1

## 2020-06-27 NOTE — Discharge Summary (Signed)
Robert Foster to be D/C'd home per NP order. Discussed with the patient and all questions fully answered. An After Visit Summary was printed and given to the patient. Prescriptions were also given to patient. Patient escorted out and D/C home via safe transport. Dickie La  06/27/2020 11:54 AM

## 2020-06-27 NOTE — Progress Notes (Signed)
Pt is sleeping. No signs of acute distress noted. Resps are even and unlabored. Staff will monitor for pt's safety.

## 2020-06-27 NOTE — ED Notes (Signed)
Snack given.

## 2020-06-27 NOTE — ED Provider Notes (Signed)
FBC/OBS ASAP Discharge Summary  Date and Time: 06/27/2020 10:47 AM  Name: Robert Foster  MRN:  [id number]   Discharge Diagnoses:  Final diagnoses:  Cocaine abuse [facility])  MDD (major depressive disorder), severe (HCC)    Subjective: Patient reports that he is doing better today.  Patient denies any suicidal homicidal ideations and denies any hallucinations.  At first patient reports that [facility] however the patient states that he really wants to go to a shelter.  He states that he needs to stay somewhere where he can come and go during the day so that he can get a outside job.  Patient reports that he has been trying to get in touch with [facility] and had talked to them on Friday but has not heard back from them.  He refuses to go to [facility] or [employer] because they would not let you have an outside job for 12 months.  Patient was provided with phone number to [facility] house and determined that he is not going to go there.  Patient request transportation to a shelter in the [place] area that he reports that he has stayed at before because he just moved to [place] about 2-1/2 months ago.  Patient is requesting prescriptions for his medications and states that he knows where the resources are in that area due to living there in the past.  Stay Summary: Patient is a 55 year old male presented to the [facility] as a walk-in reporting suicidal ideations with intent and plan to jump off of a bridge.  Patient has a history of polysubstance abuse, suicidal ideations and MDD.  Patient was reporting being on Wellbutrin in the past but has not been on any of his medications.  Patient was restarted on his home medications and admitted to the continuous observation unit overnight.  Patient remained at the facility for 2 days continued reporting suicidal ideations.  Today the patient is denying any suicidal or homicidal  ideations and denies any hallucinations.  Patient is requesting discharge to a shelter in the [place] area and requesting prescriptions for his home medications.  Patient is provided with 30-day prescriptions of his Wellbutrin XL 150 mg p.o. daily, Norvasc 10 mg p.o. daily, and lisinopril 5 mg p.o. daily.  Transportation services are being arranged for patient to a shelter in [place].  Patient continued to deny any suicidal homicidal ideations and denying hallucinations upon discharge.  Total Time spent with patient: 30 minutes  Past Psychiatric History: Polysubstance abuse, MDD Past Medical History:  Past Medical History:  Diagnosis Date  . Hypertension    No past surgical history on file. Family History: No family history on file. Family Psychiatric History: None reported Social History:  Social History   Substance and Sexual Activity  Alcohol Use Yes   Comment: 10- 1/5 bottles of wine     Social History   Substance and Sexual Activity  Drug Use Not Currently  . Types: Cocaine    Social History   Socioeconomic History  . Marital status: Single    Spouse name: Not on file  . Number of children: Not on file  . Years of education: Not on file  . Highest education level: Not on file  Occupational History  . Not on file  Tobacco Use  . Smoking status: Current Every Day Smoker    Packs/day: 0.50    Types: Cigarettes  . Smokeless tobacco: Not on  file  Substance and Sexual Activity  . Alcohol use: Yes    Comment: 10- 1/5 bottles of wine  . Drug use: Not Currently    Types: Cocaine  . Sexual activity: Not on file  Other Topics Concern  . Not on file  Social History Narrative  . Not on file   Social Determinants of Health   Financial Resource Strain: Not on file  Food Insecurity: Not on file  Transportation Needs: Not on file  Physical Activity: Not on file  Stress: Not on file  Social Connections: Not on file   SDOH:  SDOH Screenings   Alcohol Screen: Not on  file  Depression (LFY1-0): Not on file  Financial Resource Strain: Not on file  Food Insecurity: Not on file  Housing: Not on file  Physical Activity: Not on file  Social Connections: Not on file  Stress: Not on file  Tobacco Use: High Risk  . Smoking Tobacco Use: Current Every Day Smoker  . Smokeless Tobacco Use: Unknown  Transportation Needs: Not on file    Has this patient used any form of tobacco in the last 30 days? (Cigarettes, Smokeless Tobacco, Cigars, and/or Pipes) A prescription for an FDA-approved tobacco cessation medication was offered at discharge and the patient refused  Current Medications:  Current Facility-Administered Medications  Medication Dose Route Frequency Provider Last Rate Last Admin  . amLODipine (NORVASC) tablet 10 mg  10 mg Oral Daily Money, Feliz Beam B, FNP      . buPROPion (WELLBUTRIN XL) 24 hr tablet 150 mg  150 mg Oral Daily Leevy-Johnson, [name] A, NP   150 mg at 06/27/20 0901  . hydrochlorothiazide (HYDRODIURIL) tablet 25 mg  25 mg Oral Daily Leevy-Johnson, [name] A, NP   25 mg at 06/27/20 0859  . lisinopril (ZESTRIL) tablet 5 mg  5 mg Oral Daily Money, Gerlene Burdock, FNP      . traZODone (DESYREL) tablet 50 mg  50 mg Oral QHS PRN Jaclyn Shaggy, PA-C   50 mg at 06/26/20 2118   Current Outpatient Medications  Medication Sig Dispense Refill  . amLODipine (NORVASC) 10 MG tablet Take 1 tablet (10 mg total) by mouth daily. 30 tablet 0  . buPROPion (WELLBUTRIN XL) 150 MG 24 hr tablet Take 1 tablet (150 mg total) by mouth daily. For depression 30 tablet 0  . lisinopril (ZESTRIL) 5 MG tablet Take 1 tablet (5 mg total) by mouth daily. 30 tablet 0    PTA Medications: (Not in a hospital admission)   Musculoskeletal  Strength & Muscle Tone: within normal limits Gait & Station: normal Patient leans: N/A  Psychiatric Specialty Exam  Presentation  General Appearance: Appropriate for Environment; Casual  Eye Contact:Good  Speech:Clear and Coherent; Normal  Rate  Speech Volume:Normal  Handedness:Right   Mood and Affect  Mood:Euthymic  Affect:Appropriate; Congruent   Thought Process  Thought Processes:Coherent  Descriptions of Associations:Intact  Orientation:Full (Time, Place and Person)  Thought Content:WDL  Hallucinations:Hallucinations: None Description of Auditory Hallucinations: command suicidal  Ideas of Reference:None  Suicidal Thoughts:Suicidal Thoughts: No SI Active Intent and/or Plan: With Intent; With Plan SI Passive Intent and/or Plan: With Intent; With Plan  Homicidal Thoughts:Homicidal Thoughts: No   Sensorium  Memory:Immediate Good; Recent Good; Remote Good  Judgment:Good  Insight:Good   Executive Functions  Concentration:Good  Attention Span:Good  Recall:Good  Fund of Knowledge:Good  Language:Good   Psychomotor Activity  Psychomotor Activity:Psychomotor Activity: Normal   Assets  Assets:Communication Skills; Desire for Improvement; Financial Resources/Insurance; Housing; Transportation  Sleep  Sleep:Sleep: Good Number of Hours of Sleep: 19   Nutritional Assessment (For OBS and FBC admissions only) Has the patient had a weight loss or gain of 10 pounds or more in the last 3 months?: No Has the patient had a decrease in food intake/or appetite?: No Does the patient have dental problems?: Yes Does the patient have eating habits or behaviors that may be indicators of an eating disorder including binging or inducing vomiting?: No Has the patient recently lost weight without trying?: No Has the patient been eating poorly because of a decreased appetite?: No Malnutrition Screening Tool Score: 0    Physical Exam  Physical Exam Vitals and nursing note reviewed.  Constitutional:      Appearance: He is well-developed.  HENT:     Head: Normocephalic.  Eyes:     Pupils: Pupils are equal, round, and reactive to light.  Cardiovascular:     Rate and Rhythm: Normal rate.  Pulmonary:      Effort: Pulmonary effort is normal.  Musculoskeletal:        General: Normal range of motion.  Neurological:     Mental Status: He is alert and oriented to person, place, and time.    Review of Systems  Constitutional: Negative.   HENT: Negative.   Eyes: Negative.   Respiratory: Negative.   Cardiovascular: Negative.   Gastrointestinal: Negative.   Genitourinary: Negative.   Musculoskeletal: Negative.   Skin: Negative.   Neurological: Negative.   Endo/Heme/Allergies: Negative.   Psychiatric/Behavioral: Negative.    Blood pressure (!) 157/107, pulse 74, temperature 98.6 F (37 C), temperature source Oral, resp. rate 16, SpO2 98 %. There is no height or weight on file to calculate BMI.  Demographic Factors:  Male and Low socioeconomic status  Loss Factors: NA  Historical Factors: NA  Risk Reduction Factors:   Employed, Living with another person, especially a relative and Positive social support  Continued Clinical Symptoms:  Alcohol/Substance Abuse/Dependencies Previous Psychiatric Diagnoses and Treatments  Cognitive Features That Contribute To Risk:  None    Suicide Risk:  Minimal: No identifiable suicidal ideation.  Patients presenting with no risk factors but with morbid ruminations; may be classified as minimal risk based on the severity of the depressive symptoms  Plan Of Care/Follow-up recommendations:  Continue activity as tolerated. Continue diet as recommended by your PCP. Ensure to keep all appointments with outpatient providers.  Disposition: Discharge to shelter per patients request  Maryfrances Bunnell, FNP 06/27/2020, 10:47 AM

## 2020-06-27 NOTE — Telephone Encounter (Signed)
D:  Danny Sprinkle (counselor) referred pt to [facility].  After reading pt's chart, case manager will forward info to [employer], LCSW, LCAS ([facility]).  A:  Informed Danny.

## 2020-06-27 NOTE — Discharge Instructions (Addendum)

## 2020-06-27 NOTE — ED Notes (Signed)
Sleeping at present, no distress noted, monitoring for safety. 

## 2020-06-27 NOTE — Progress Notes (Addendum)
Pt is currently awake and is on the phone. Pt took is medications with no incident. Pt denies pain, SI, HI and AVH at this time. Pt's safety is maintained.

## 2021-04-27 LAB — HEMOGLOBIN A1C
Estimated Avg Glucose, External: 114 mg/dL
Hemoglobin A1C, External: 5.6 % (ref <5.7)

## 2021-08-17 ENCOUNTER — Emergency Department: Admit: 2021-08-18 | Payer: MEDICAID

## 2021-08-17 DIAGNOSIS — I10 Essential (primary) hypertension: Secondary | ICD-10-CM

## 2021-08-17 DIAGNOSIS — S86911A Strain of unspecified muscle(s) and tendon(s) at lower leg level, right leg, initial encounter: Secondary | ICD-10-CM

## 2021-08-17 NOTE — ED Provider Notes (Signed)
Emergency Department Provider Note                   PCP:                No primary care provider on file.               Age: 56 y.o.      Sex: male   Final diagnosis/impression:  1. Knee strain, right, initial encounter       Disposition: Discharge to home    MDM/Clinical Course:  Patient seen by myself at the [facility] emergency department. Patient had signs symptoms and clinical history most consistent with knee pain symptoms s/p fall. Radiology shows no acute fracture dislocation but severe osteoarthritis, bone fragments, soft tissue edema. While under my care, patient received PO ibuprofen, PO decadron. Recommended rest, ice, compression, f/u with a primary care physician.  Patient is completely mobile with his knee and the knee itself is stable.  Patient fitted with a knee hinge support brace to help with pain symptoms and in case patient with soft tissue injury.  Outpatient orthopedics referral made.  Patient given information regarding [facility]. Outpatient prescription for ibuprofen written and I recommended to the patient that he follow-up with as possible with orthopedics..  Patient/family given instructions to return as needed for any questions, concerns or worsening symptoms, particularly those as outlined in the disposition section / discharge section of patient discharge paperwork. Patient/family verbalizes understanding agreement with ED course/plan in shared medical decision making. Questions answered.    Complexity of Problems Addressed:  Acute problem with uncertain diagnosis/prognosis    Data Reviewed and Analyzed:  Category 1:   I reviewed external records: ED visit note from an outside group.  Reviewed/8/23 outside ER visit for suicidal ideation/hopelessness  I ordered each unique test.  I reviewed the results of each unique test.    Category 2:   Radiology:  My independent review of patient knee x-ray shows severe osteoarthritis, patellar degeneration/remodeling as well  as multiple osseous fragments that appear to be chronic and I suspect arthritis related, no obvious fracture or dislocation    Category 3: Discussion of management or test interpretation.  See MDM / clinical course section above for details    Risk of Complications and/or Morbidity of Patient Management:  Prescription drug management performed.  Shared medical decision making was utilized in creating the patients health plan today.  Diagnosis or care significantly limited by social determinants of health Homelessness.       Orders Placed This Encounter   Procedures    XR KNEE RIGHT (3 VIEWS)    [facility] [employer]    [facility] [employer] Hinged Knee Support, Right; M      ED Meds Given:  Medications   dexamethasone (DECADRON) Oral 8 mg (8 mg Oral Given 08/17/21 2322)   ibuprofen (ADVIL;MOTRIN) tablet 600 mg (600 mg Oral Given 08/17/21 2323)     Discharge Medication List as of 08/18/2021 12:40 AM        START taking these medications    Details   ibuprofen (ADVIL;MOTRIN) 600 MG tablet Take 1 tablet by mouth in the morning, at noon, and at bedtime for 7 days Take with food, Disp-21 tablet, R-0Print               ___________________    HPI: Carlos Burton is a 56 y.o. male with past medical history of hypertension,  anxiety/depression presenting for evaluation of right knee pain.  Patient fell yesterday and is having of 8 of 10 knee pain, primarily localized to the posterior fossa of the knee.  Worse with weightbearing or certain movements.  Range of motion is intact.  Pain symptoms started/worsened after a slip and fall incident yesterday.  Sensation and motor function is intact.  Patient/family denies any other evaluation for today's acute complaint. Patient/family denies any other aggravating or alleviating factors. Patient/family denies any other symptoms.    ROS:   All review of systems negative except as noted above in the history of the present  illness.    Past Medical/ Family/ Social History:     Medical history: No past medical history on file.  HTN, Anxiety, depression  Surgical history: No past surgical history on file.  No previous surgeries pertinent to patient chief complaint  Family history: No family history on file.  No pertinent family medical history  Social history:   Social History     Socioeconomic History    Marital status: Legally Separated        Medications:   Discharge Medication List as of 08/18/2021 12:40 AM       Amlodipine  Trazodone  Lisinopril  Prozac      Allergies: No Known Allergies    Physical Exam   Vitals signs reviewed.   Patient Vitals for the past 4 hrs:   Temp Pulse Resp BP SpO2   08/18/21 0044 -- -- -- (!) 169/100 --   08/17/21 2313 98.1 F (36.7 C) 84 19 (!) 172/121 97 %     General: Alert and oriented 4, no acute distress   Eyes: Anicteric, conjunctiva pink, PERRLA, EOMI  ENT: No nasal discharge, no gross nasal congestion present  Pulmonary: Clear to auscultation bilaterally with symmetric chest rise, no increased work of breathing, no accessory muscle use  Cardiovascular: Regular rate and rhythm, no rub or gallop appreciated on my exam  GI: Abdomen is soft, nontender, nondistended  Musculoskeletal: No obvious joint deformity or joint effusion, normal joint range of motion, tenderness to palpation along the posterior fossa of the knee without obvious deformity, there is some anterior soft tissue in the swelling this area is generally nontender to palpation  Neuro: Cranial nerves II through VII grossly intact, strength and sensation is grossly intact in the upper and lower extremities bilaterally  Skin: Skin is warm and dry    Procedures  Results for orders placed or performed during the hospital encounter of 08/17/21   XR KNEE RIGHT (3 VIEWS)    Narrative    EXAMINATION: Right knee, 3 views    DATE OF EXAM: 08/17/2021 11:15 PM    HISTORY: Knee pain, fall    COMPARISON: None.    FINDINGS/    Impression    No acute  fractures present. Markedly advanced tricompartmental osteoarthritis is   noted with large joint effusion. Mild prepatellar soft tissue edema is noted as   well. Multiple intra-articular free bodies are present, the largest measuring   up to 1.8 cm, located in the suprapatellar compartment.        Thank you for the referral of this patient. This exam was interpreted by an   Biomedical engineer of Radiology certified radiologist with subspecialty training. If   there are any questions regarding this exam please feel free to contact a   radiologist directly at [phone].      Slot: 70     Caprice Red, M.D.  08/18/2021 12:02:00 AM      XR KNEE RIGHT (3 VIEWS)   Final Result      No acute fractures present. Markedly advanced tricompartmental osteoarthritis is    noted with large joint effusion. Mild prepatellar soft tissue edema is noted as    well. Multiple intra-articular free bodies are present, the largest measuring    up to 1.8 cm, located in the suprapatellar compartment.            Thank you for the referral of this patient. This exam was interpreted by an    Biomedical engineer of Radiology certified radiologist with subspecialty training. If    there are any questions regarding this exam please feel free to contact a    radiologist directly at [phone].         Slot: 86       Caprice Red, M.D.    08/18/2021 12:02:00 AM                      Voice dictation software was used during the making of this note.  This software is not perfect and grammatical and other typographical errors may be present.  This note has not been completely proofread for errors.     Ann Maki, MD  08/18/21 [phone]

## 2021-08-17 NOTE — ED Triage Notes (Signed)
Ambulatory to triage. States recent fall. C/O knee pain. NAD. Denies CP or SHOB at this time

## 2021-08-17 NOTE — Discharge Instructions (Addendum)
Return as needed for questions, concerns or worsening symptoms

## 2021-08-18 ENCOUNTER — Inpatient Hospital Stay
Admit: 2021-08-18 | Discharge: 2021-08-18 | Disposition: A | Discharge status: Home / Self Care | Payer: MEDICAID | Attending: Emergency Medicine

## 2021-08-18 MED ORDER — IBUPROFEN 600 MG PO TABS
600 MG | ORAL | Status: AC
Start: 2021-08-18 — End: 2021-08-17
  Administered 2021-08-18: 03:00:00 600 mg via ORAL

## 2021-08-18 MED ORDER — IBUPROFEN 600 MG PO TABS
600 MG | ORAL_TABLET | Freq: Three times a day (TID) | ORAL | 0 refills | Status: AC
Start: 2021-08-18 — End: 2021-08-24

## 2021-08-18 MED ORDER — DEXAMETHASONE SODIUM PHOSPHATE 10 MG/ML IJ SOLN
10 MG/ML | Freq: Once | INTRAMUSCULAR | Status: AC
Start: 2021-08-18 — End: 2021-08-17
  Administered 2021-08-18: 03:00:00 8 mg via ORAL

## 2021-08-18 MED FILL — IBUPROFEN 600 MG PO TABS: 600 MG | ORAL | Qty: 1

## 2021-08-18 MED FILL — DEXAMETHASONE SODIUM PHOSPHATE 10 MG/ML IJ SOLN: 10 MG/ML | INTRAMUSCULAR | Qty: 1

## 2021-08-18 NOTE — ED Notes (Signed)
Pt states that he does not want the knee immobilizer and would prefer an ace wrap instead. Pt provided with 2 ace wraps to go home with. Dr Gerrianne Scale aware.      Consuello Bossier, RN  08/18/21 [phone]

## 2021-08-19 DIAGNOSIS — R45851 Suicidal ideations: Secondary | ICD-10-CM

## 2021-08-19 NOTE — Consults (Signed)
Arranged consult with [facility] via web site. Consult ID: [id number]

## 2021-08-19 NOTE — ED Provider Notes (Signed)
[name]  Emergency Department    DISPOSITION Behavioral Health Hold 08/19/2021 11:32:03 PM       ICD-10-CM    1. Suicidal ideation  R45.851       2. Anxiety and depression  F41.9     F32.A         ED Course     ED Course as of 08/20/21 9563   Sat Aug 19, 2021   2032 Patient is a 56 year old male with history of depression, anxiety, and hypertension who presents to facility today stating he is suicidal.  He states he has a plan which was to take all his pills.  He states he attempted taking his life at 56 years old by the same means.  He is not currently seeing anybody for his mental health.  He states he is on Prozac which he does not believe is helping.  We will get some lab work and have a psychiatric evaluation performed to determine best plan of care the patient.  Patient is agreeable at this time. [TT]   2330 I have received psychiatrist recommendation.  They recommend admission to inpatient psychiatric service to stabilize patient's mood.  Patient is voluntary for admission.  They are recommending he go on 150 mg of bupropion every morning for his depression anxiety.  Patient is agreeable to this plan.  They are recommend he stop his Prozac.  They are recommending CIWA scoring at this time as well. [TT]   2334 Patient currently resting comfortably in his room. Buproprion has been ordered for morning. [TT]   Sun Aug 20, 2021   0624 I have discussed the patient with oncoming morning physician so he is aware.  Patient continues to rest comfortably in his room.  He should receive his morning dose of bupropion as ordered. [TT]      ED Course User Index  [TT] Conard Novak, PA-C     Complexity of Problems Addressed:  1 or more acute illnesses that pose a threat to life or bodily function.     Data Reviewed and Analyzed:  Category 1:   I reviewed external records: ED visit note from an outside group.  I ordered each unique test.  I interpreted the results of each unique test.        Category  2:   ED EKG was independently interpreted in the absence of a cardiologist.  Rate: 65  EKG Interpretation: EKG Interpretation: sinus rhythm, no evidence of arrhythmia  ST Segments: Nonspecific ST segments - NO STEMI    I independently interpreted the cardiac monitor rhythm strip see above.  I interpreted the labs.    Category 3: Discussion of management or test interpretation.  See ED Course above    Is this patient to be included in the SEP-1 core measure due to severe sepsis or septic shock? No Exclusion criteria - the patient is NOT to be included for SEP-1 Core Measure due to: Infection is not suspected     HPI   Carlos Burton is a 56 y.o. male with a history of depression, anxiety, hypertension who presents to the ED with complaint of feeling suicidal with plan to overdose on his medications.  He states he takes Prozac for his depression/anxiety.  He states he does feel his dose is helping.  He states he also takes amlodipine and lisinopril for blood pressure.  He states he has attempted to take his life at 56 years old for the same method of  overdosing on prescription medications.  He reports he is not taking any medicine here tonight and states he came here because he wanted help.  He is not currently seen by anybody for his psychiatric conditions.  Patient has no intent to hurt others and never has he reports.  He states these increase in depression and suicidal ideation going over the past 48 hours.    ROS   Review of Systems   Constitutional:  Negative for chills and fever.   Respiratory:  Negative for shortness of breath.    Cardiovascular:  Negative for chest pain.   Gastrointestinal:  Negative for abdominal pain, nausea and vomiting.   Musculoskeletal:  Negative for gait problem.   Psychiatric/Behavioral:  Positive for suicidal ideas. Negative for agitation, behavioral problems, confusion, hallucinations and self-injury. The patient is not nervous/anxious and is not hyperactive.    All other systems  reviewed and are negative.    History   No past medical history on file.  No past surgical history on file.  No family history on file.  No Known Allergies    Physical Exam     Vitals:    08/19/21 2016 08/20/21 0615   BP: (!) 144/91 (!) 149/102   Pulse: 68 65   Resp: 19 16   Temp: 98 F (36.7 C) 97.7 F (36.5 C)   TempSrc: Oral Oral   SpO2: 96% 94%   Weight: 200 lb (90.7 kg)    Height: 5\' 9"  (1.753 m)      Nursing note and vitals reviewed.    Constitutional: Well developed, NAD  HEENT: Atraumatic, conjugate gaze, EOM intact  Neck: Supple  Cardiovascular: Regular rate and rhythm, no murmur appreciated  Respiratory: Effort normal. No respiratory distress. Lungs CTAB.  Gastrointestinal: Bowel sounds present. Non-distended. No guarding or rebound. Non-tender.  MSK: No deformities appreciated. No peripheral edema.  Skin: Skin is warm and dry. No rash appreciated.  Neuro: Alert and oriented, moves all four extremities.  Psych: Pleasant and cooperative; calm    Procedures   Procedures    MDM     Labs Reviewed   CBC WITH AUTO DIFFERENTIAL - Abnormal; Notable for the following components:       Result Value    Eosinophils % 12 (*)     All other components within normal limits   COMPREHENSIVE METABOLIC PANEL - Abnormal; Notable for the following components:    Chloride 113 (*)     Glucose 116 (*)     All other components within normal limits   SALICYLATE LEVEL - Abnormal; Notable for the following components:    Salicylate, Serum <1.7 (*)     All other components within normal limits   ACETAMINOPHEN LEVEL - Abnormal; Notable for the following components:    Acetaminophen Level <10 (*)     All other components within normal limits   ETHANOL   MAGNESIUM   URINE DRUG SCREEN     Medications   buPROPion [facility] SR) extended release tablet 150 mg (has no administration in time range)     No orders to display     Voice dictation software was used during the making of this note. This software is not perfect and grammatical and  other typographical errors may be present. This note has not been completely proofread for errors.     Conard Novak, PA-C  08/20/21 [phone]

## 2021-08-19 NOTE — ED Provider Notes (Signed)
Carlos Burton ED Psychiatric RECHECK NOTE for 08/21/2021  Arrival Date/Time: 08/19/2021  8:21 PM      Carlos Burton  MRN: [id number]    Date of Birth: [birth year]   56 y.o. male    [facility] EMERGENCY DEPT ER12/12  Reeval on 08/21/2021 @ 10:50 AM       He is noton commitment papers. The papers expire on:     He has completed a tele-psych evaluation.     Carlos Burton is a 56 y.o. male originally presented for suicidal thoughts.      Interval history: stable.  Unable to place.  Willing to go to shelter for outpatient services.     Vitals:    08/19/21 2016 08/20/21 0615 08/21/21 0300 08/21/21 0733   BP: (!) 144/91 (!) 149/102 138/68 (!) 151/112   Pulse: 68 65 78 63   Resp: 19 16 16 20    Temp: 98 F (36.7 C) 97.7 F (36.5 C) 97.2 F (36.2 C) 97.5 F (36.4 C)   TempSrc: Oral Oral  Oral   SpO2: 96% 94% 96% 96%   Weight: 200 lb (90.7 kg)      Height: 5\' 9"  (1.753 m)         Current Facility-Administered Medications   Medication Dose Route Frequency    buPROPion [facility] SR) extended release tablet 150 mg  150 mg Oral QAM AC     Current Outpatient Medications   Medication Sig    ibuprofen (ADVIL;MOTRIN) 600 MG tablet Take 1 tablet by mouth in the morning, at noon, and at bedtime for 7 days Take with food       Assessment and Plan:  Malingering;  discharge to shelter.      , DO; 08/21/2021 10:50 AM ===============                       Arna Medici, DO  08/21/21 1051

## 2021-08-19 NOTE — ED Provider Notes (Signed)
Carlos Burton ED Psychiatric RECHECK NOTE for 08/20/2021  Arrival Date/Time: 08/19/2021  8:21 PM      Carlos Burton  MRN: [id number]    Date of Birth: 13-Dec-1965   56 y.o. male    [facility] EMERGENCY DEPT ER12/12  Reeval on 08/20/2021 @ 12:35 PM       He is noton commitment papers. The papers expire on: N/A    He has not completed a tele-psych evaluation.     Carlos Burton is a 56 y.o. male originally presented for suicidal thoughts.      Interval history: no complaints at this time  Vitals:    08/20/21 0615   BP: (!) 149/102   Pulse: 65   Resp: 16   Temp: 97.7 F (36.5 C)   SpO2: 94%      Current Facility-Administered Medications   Medication Dose Route Frequency    buPROPion (WELLBUTRIN SR) extended release tablet 150 mg  150 mg Oral QAM AC     Current Outpatient Medications   Medication Sig    ibuprofen (ADVIL;MOTRIN) 600 MG tablet Take 1 tablet by mouth in the morning, at noon, and at bedtime for 7 days Take with food       Assessment and Plan:  Pt here voluntarily and awaiting evaluation by psych case management for assistance with F/u vs placement.       Lance Sell, MD; 08/20/2021 12:35 PM ===============                      Lance Sell, MD  08/20/21 [phone]

## 2021-08-19 NOTE — ED Triage Notes (Signed)
Patient arrives via self from home. Patient states he feels depressed and suicidal. Patient states his plan to commit suicide is to "take all of the medicine in his bag." Patient denies having any homicidal thoughts. Patient denies chest pain or SHOB.

## 2021-08-19 NOTE — ED Notes (Signed)
Constant Observer Yes - Name: Naomie Dean Observer Oriented yes   High risk patients are in line of sight at all times Yes   Excess equipment/medical supplies not necessary for the care of the patient removed Yes   All sharp or dangerous objects are removed from room: including but not limited to belts, pens & pencils, needles, medications, cosmetics, lighters, matches, nail files, watches, necklaces, glass objects, razors, razor blades, knives, aerosol sprays, drawstring pants, shoes, cords (telephone, call bells, etc.) cleaning wipes or other cleaning items, aluminum cans, not permanently attached wall dcor Yes   Telephone/cell phone removed as well as TV remote (batteries can be swallowed) Yes   Patient belongings removed and labeled at nurses station Yes   Excess linen is removed from room Yes   All plastic bags are removed from the room and replaced with paper trash bags Yes   Patient is in paper scrubs or appropriate gown and using hospital socks with rubber soles Yes   No metal, hard eating utensils or hard plates are on meal tray Yes   Remove all cleaning agents used by [employer] Yes   If Crucifix is hanging on a nail, remove Crucifix as well as the nail Yes       *If any question above is answered "No," documentation is required.       Gloriann Loan, RN  08/19/21 2125

## 2021-08-20 ENCOUNTER — Inpatient Hospital Stay
Admit: 2021-08-20 | Discharge: 2021-08-21 | Disposition: A | Discharge status: Home / Self Care | Payer: BLUE CROSS/BLUE SHIELD | Attending: Emergency Medicine

## 2021-08-20 LAB — CBC WITH AUTO DIFFERENTIAL
Absolute Immature Granulocyte: 0 10*3/uL (ref 0.0–0.5)
Basophils %: 1 % (ref 0.0–2.0)
Basophils Absolute: 0.1 10*3/uL (ref 0.0–0.2)
Eosinophils %: 12 % — ABNORMAL HIGH (ref 0.5–7.8)
Eosinophils Absolute: 0.7 10*3/uL (ref 0.0–0.8)
Hematocrit: 48.3 % (ref 41.1–50.3)
Hemoglobin: 16.3 g/dL (ref 13.6–17.2)
Immature Granulocytes: 0 % (ref 0.0–5.0)
Lymphocytes %: 31 % (ref 13–44)
Lymphocytes Absolute: 1.8 10*3/uL (ref 0.5–4.6)
MCH: 29.6 PG (ref 26.1–32.9)
MCHC: 33.7 g/dL (ref 31.4–35.0)
MCV: 87.8 FL (ref 82–102)
MPV: 10.5 FL (ref 9.4–12.3)
Monocytes %: 9 % (ref 4.0–12.0)
Monocytes Absolute: 0.5 10*3/uL (ref 0.1–1.3)
Neutrophils %: 47 % (ref 43–78)
Neutrophils Absolute: 2.6 10*3/uL (ref 1.7–8.2)
Platelets: 295 10*3/uL (ref 150–450)
RBC: 5.5 M/uL (ref 4.23–5.6)
RDW: 13.6 % (ref 11.9–14.6)
WBC: 5.7 10*3/uL (ref 4.3–11.1)
nRBC: 0 10*3/uL (ref 0.0–0.2)

## 2021-08-20 LAB — URINE DRUG SCREEN
Amphetamine, Urine: NEGATIVE
Barbiturates, Urine: NEGATIVE
Benzodiazepines, Urine: NEGATIVE
Cocaine, Urine: NEGATIVE
Methadone, Urine: NEGATIVE
Opiates, Urine: NEGATIVE
PCP, Urine: NEGATIVE
THC, TH-Cannabinol, Urine: NEGATIVE

## 2021-08-20 LAB — COMPREHENSIVE METABOLIC PANEL
ALT: 29 U/L (ref 12–65)
AST: 20 U/L (ref 15–37)
Albumin/Globulin Ratio: 1.1 (ref 0.4–1.6)
Albumin: 3.5 g/dL (ref 3.5–5.0)
Alk Phosphatase: 91 U/L (ref 50–136)
Anion Gap: 3 mmol/L (ref 2–11)
BUN: 14 MG/DL (ref 6–23)
CO2: 26 mmol/L (ref 21–32)
Calcium: 8.8 MG/DL (ref 8.3–10.4)
Chloride: 113 mmol/L — ABNORMAL HIGH (ref 101–110)
Creatinine: 0.9 MG/DL (ref 0.8–1.5)
Est, Glom Filt Rate: >60 mL/min/{1.73_m2} (ref >60)
Globulin: 3.3 g/dL (ref 2.8–4.5)
Glucose: 116 mg/dL — ABNORMAL HIGH (ref 65–100)
Potassium: 3.5 mmol/L (ref 3.5–5.1)
Sodium: 142 mmol/L (ref 133–143)
Total Bilirubin: 0.5 MG/DL (ref 0.2–1.1)
Total Protein: 6.8 g/dL (ref 6.3–8.2)

## 2021-08-20 LAB — EKG 12-LEAD
Atrial Rate: 65 {beats}/min
Diagnosis: NORMAL
P Axis: 44 degrees
P-R Interval: 142 ms
Q-T Interval: 410 ms
QRS Duration: 88 ms
QTc Calculation (Bazett): 426 ms
R Axis: -29 degrees
T Axis: -15 degrees
Ventricular Rate: 65 {beats}/min

## 2021-08-20 LAB — ETHANOL: Ethanol Lvl: <3 MG/DL

## 2021-08-20 LAB — MAGNESIUM: Magnesium: 1.8 mg/dL (ref 1.8–2.4)

## 2021-08-20 LAB — SALICYLATE LEVEL: Salicylate, Serum: <1.7 MG/DL — ABNORMAL LOW (ref 2.8–20.0)

## 2021-08-20 LAB — ACETAMINOPHEN LEVEL: Acetaminophen Level: <10 ug/mL — ABNORMAL LOW (ref 10.0–30.0)

## 2021-08-20 MED ORDER — BUPROPION HCL 75 MG PO TABS
75 MG | Freq: Every day | ORAL | Status: DC
Start: 2021-08-20 — End: 2021-08-19

## 2021-08-20 MED ORDER — BUPROPION HCL ER (SR) 150 MG PO TB12
150 MG | Freq: Every day | ORAL | Status: AC
Start: 2021-08-20 — End: 2021-08-21
  Administered 2021-08-20 – 2021-08-21 (×2): 150 mg via ORAL

## 2021-08-20 MED FILL — BUPROPION HCL ER (SR) 150 MG PO TB12: 150 MG | ORAL | Qty: 1

## 2021-08-20 NOTE — ED Notes (Signed)
Report from [facility], transfer of care at this time.     Elnoria Howard, RN  08/20/21 [phone]

## 2021-08-20 NOTE — ED Notes (Signed)
Patient resting at this time. No signs or symptoms of distress. Respirations even and unlabored. Sitter at bedside. Safety precautions in place.      Amedeo Plenty, RN  08/20/21 [phone]

## 2021-08-20 NOTE — ED Notes (Signed)
Received report from [place], Therapist, sports. Transfer of care      Beatrix Fetters, RN  08/20/21 2032

## 2021-08-20 NOTE — ED Notes (Signed)
Patient resting at this time. No signs or symptoms of distress. Respirations even and unlabored. Sitter at bedside. Safety precautions in place.      Amedeo Plenty, RN  08/20/21 [phone]

## 2021-08-20 NOTE — ED Notes (Signed)
Pt resting comfortably in bed with unlabored respirations      Beatrix Fetters, RN  08/20/21 [phone]

## 2021-08-20 NOTE — ED Notes (Signed)
Patient given breakfast tray, patient asleep, respirations non labored and regular. Bed in low and locked position. Safety attendant at bedside     Elnoria Howard, [place]  08/20/21 [phone]

## 2021-08-20 NOTE — ED Notes (Signed)
Patient resting at this time. No signs or symptoms of distress. Respirations even and unlabored. Sitter at bedside. Safety precautions in place.      [place], RN  08/20/21 Vernelle Emerald

## 2021-08-20 NOTE — ED Notes (Signed)
Patient resting comfortably at this time. Respirations even and unlabored. Patient woke up at this time and requested something to eat. After receiving food, patient stated no further needs.     Jenne Campus, RN  08/20/21 [phone]

## 2021-08-20 NOTE — ED Notes (Signed)
Constant Observer Yes - Name: Addison Bailey Observer Oriented yes   High risk patients are in line of sight at all times Yes   Excess equipment/medical supplies not necessary for the care of the patient removed Yes   All sharp or dangerous objects are removed from room: including but not limited to belts, pens & pencils, needles, medications, cosmetics, lighters, matches, nail files, watches, necklaces, glass objects, razors, razor blades, knives, aerosol sprays, drawstring pants, shoes, cords (telephone, call bells, etc.) cleaning wipes or other cleaning items, aluminum cans, not permanently attached wall dcor Yes   Telephone/cell phone removed as well as TV remote (batteries can be swallowed) Yes   Patient belongings removed and labeled at nurses station Yes   Excess linen is removed from room Yes   All plastic bags are removed from the room and replaced with paper trash bags Yes   Patient is in paper scrubs or appropriate gown and using hospital socks with rubber soles Yes   No metal, hard eating utensils or hard plates are on meal tray Yes   Remove all cleaning agents used by [employer] Yes   If Crucifix is hanging on a nail, remove Crucifix as well as the nail Yes       *If any question above is answered "No," documentation is required.       Gloriann Loan, RN  08/20/21 2033

## 2021-08-20 NOTE — ED Notes (Signed)
Took over care of patient at this time. Patient resting comfortably in bed. Respirations even and unlabored. No needs stated at this time.     Jenne Campus, RN  08/20/21 [phone]

## 2021-08-20 NOTE — ED Notes (Signed)
Patient resting at this time. No signs or symptoms of distress. Respirations even and unlabored. Sitter at bedside. Safety precautions in place.      Amedeo Plenty, RN  08/20/21 [phone]

## 2021-08-20 NOTE — ED Notes (Signed)
Patient resting at this time. No signs or symptoms of distress. Respirations even and unlabored. Sitter at bedside. Safety precautions in place.      [place], RN  08/20/21 Vernelle Emerald

## 2021-08-20 NOTE — ED Notes (Signed)
Patient resting comfortably at this time. Respirations even and unlabored. No needs stated.     Jenne Campus, RN  08/20/21 [phone]

## 2021-08-20 NOTE — ED Notes (Signed)
Patient resting at this time. No signs or symptoms of distress. Respirations even and unlabored. Sitter at bedside. Safety precautions in place.      Amedeo Plenty, RN  08/20/21 [phone]

## 2021-08-20 NOTE — ED Notes (Signed)
Constant Observer Yes - Name: Ranae Plumber Observer Oriented yes   High risk patients are in line of sight at all times Yes   Excess equipment/medical supplies not necessary for the care of the patient removed Yes   All sharp or dangerous objects are removed from room: including but not limited to belts, pens & pencils, needles, medications, cosmetics, lighters, matches, nail files, watches, necklaces, glass objects, razors, razor blades, knives, aerosol sprays, drawstring pants, shoes, cords (telephone, call bells, etc.) cleaning wipes or other cleaning items, aluminum cans, not permanently attached wall dcor Yes   Telephone/cell phone removed as well as TV remote (batteries can be swallowed) Yes   Patient belongings removed and labeled at nurses station Yes   Excess linen is removed from room Yes   All plastic bags are removed from the room and replaced with paper trash bags Yes   Patient is in paper scrubs or appropriate gown and using hospital socks with rubber soles Yes   No metal, hard eating utensils or hard plates are on meal tray Yes   Remove all cleaning agents used by [employer] Yes   If Crucifix is hanging on a nail, remove Crucifix as well as the nail Yes       *If any question above is answered "No," documentation is required.       Drema Pry, RN  08/20/21 [phone]

## 2021-08-20 NOTE — ED Notes (Signed)
Patient asleep and comfortable. Respirations even and unlabored. No distress noted.     Jenne Campus, RN  08/20/21 [phone]

## 2021-08-20 NOTE — ED Notes (Signed)
Constant Observer Yes - Name: Ranae Plumber Observer Oriented yes   High risk patients are in line of sight at all times Yes   Excess equipment/medical supplies not necessary for the care of the patient removed Yes   All sharp or dangerous objects are removed from room: including but not limited to belts, pens & pencils, needles, medications, cosmetics, lighters, matches, nail files, watches, necklaces, glass objects, razors, razor blades, knives, aerosol sprays, drawstring pants, shoes, cords (telephone, call bells, etc.) cleaning wipes or other cleaning items, aluminum cans, not permanently attached wall dcor Yes   Telephone/cell phone removed as well as TV remote (batteries can be swallowed) Yes   Patient belongings removed and labeled at nurses station Yes   Excess linen is removed from room Yes   All plastic bags are removed from the room and replaced with paper trash bags Yes   Patient is in paper scrubs or appropriate gown and using hospital socks with rubber soles Yes   No metal, hard eating utensils or hard plates are on meal tray Yes   Remove all cleaning agents used by [employer] Yes   If Crucifix is hanging on a nail, remove Crucifix as well as the nail No - N/A       *If any question above is answered "No," documentation is required.       Era Skeen, RN  08/20/21 1410

## 2021-08-20 NOTE — ED Notes (Signed)
Patient resting comfortably at this time. Respiration even and unlabored. Patient woke up to take morning medicine. No needs stated.     Jenne Campus, RN  08/20/21 [phone]

## 2021-08-20 NOTE — ED Notes (Signed)
Patient resting in bed comfortably. Respirations even and unlabored. No needs stated at this time.     Jenne Campus, RN  08/20/21 [phone]

## 2021-08-20 NOTE — ED Notes (Signed)
Pt resting comfortably in bed with unlabored respirations      Beatrix Fetters, RN  08/20/21 [phone]

## 2021-08-20 NOTE — ED Notes (Signed)
Patient resting at this time. No signs or symptoms of distress. Respirations even and unlabored. Sitter at bedside. Safety precautions in place.      Amedeo Plenty, RN  08/20/21 2007

## 2021-08-20 NOTE — ED Notes (Signed)
Assumed care of patient at this time.      Amedeo Plenty, RN  08/20/21 1357

## 2021-08-20 NOTE — ED Notes (Signed)
Pt resting comfortably in bed with unlabored respirations      Beatrix Fetters, RN  08/20/21 [phone]

## 2021-08-20 NOTE — ED Notes (Signed)
Patient resting comfortably at this time. Respirations even and unlabored. No needs stated at this time.     Jenne Campus, RN  08/20/21 [phone]

## 2021-08-20 NOTE — ED Notes (Signed)
Patient resting at this time. No signs or symptoms of distress. Respirations even and unlabored. Sitter at bedside. Safety precautions in place.      [place], RN  08/20/21 Vernelle Emerald

## 2021-08-20 NOTE — ED Notes (Signed)
Patient resting at this time. No signs or symptoms of distress. Respirations even and unlabored. Sitter at bedside. Safety precautions in place.      Amedeo Plenty, RN  08/20/21 [phone]

## 2021-08-20 NOTE — ED Notes (Signed)
Patient resting at this time. No signs or symptoms of distress. Respirations even and unlabored. Sitter at bedside. Safety precautions in place.      Amedeo Plenty, RN  08/20/21 2006

## 2021-08-20 NOTE — ED Notes (Addendum)
Constant Observer Yes - Name: [name]   Constant Observer Oriented yes   High risk patients are in line of sight at all times Yes   Excess equipment/medical supplies not necessary for the care of the patient removed Yes   All sharp or dangerous objects are removed from room: including but not limited to belts, pens & pencils, needles, medications, cosmetics, lighters, matches, nail files, watches, necklaces, glass objects, razors, razor blades, knives, aerosol sprays, drawstring pants, shoes, cords (telephone, call bells, etc.) cleaning wipes or other cleaning items, aluminum cans, not permanently attached wall dcor Yes   Telephone/cell phone removed as well as TV remote (batteries can be swallowed) Yes   Patient belongings removed and labeled at nurses station Yes   Excess linen is removed from room Yes   All plastic bags are removed from the room and replaced with paper trash bags Yes   Patient is in paper scrubs or appropriate gown and using hospital socks with rubber soles Yes   No metal, hard eating utensils or hard plates are on meal tray Yes   Remove all cleaning agents used by [employer] Yes   If Crucifix is hanging on a nail, remove Crucifix as well as the nail N/A       *If any question above is answered "No," documentation is required.       Daneen Schick, RN  08/20/21 7824       Daneen Schick, RN  08/20/21 [phone]

## 2021-08-21 MED ORDER — BUPROPION HCL ER (SMOKING DET) 150 MG PO TB12
150 MG | ORAL_TABLET | Freq: Every morning | ORAL | 1 refills | Status: AC
Start: 2021-08-21 — End: ?

## 2021-08-21 MED FILL — BUPROPION HCL ER (SR) 150 MG PO TB12: 150 MG | ORAL | Qty: 1

## 2021-08-21 NOTE — ED Notes (Signed)
Patient resting comfortably in bed. Respirations even and unlabored. No needs stated.     Jenne Campus, RN  08/21/21 [phone]

## 2021-08-21 NOTE — ED Notes (Signed)
Constant Observer Yes - Name: Alexa   Constant Observer Oriented yes   High risk patients are in line of sight at all times Yes   Excess equipment/medical supplies not necessary for the care of the patient removed Yes   All sharp or dangerous objects are removed from room: including but not limited to belts, pens & pencils, needles, medications, cosmetics, lighters, matches, nail files, watches, necklaces, glass objects, razors, razor blades, knives, aerosol sprays, drawstring pants, shoes, cords (telephone, call bells, etc.) cleaning wipes or other cleaning items, aluminum cans, not permanently attached wall dcor Yes   Telephone/cell phone removed as well as TV remote (batteries can be swallowed) Yes   Patient belongings removed and labeled at nurses station Yes   Excess linen is removed from room Yes   All plastic bags are removed from the room and replaced with paper trash bags Yes   Patient is in paper scrubs or appropriate gown and using hospital socks with rubber soles Yes   No metal, hard eating utensils or hard plates are on meal tray Yes   Remove all cleaning agents used by [employer] Yes   If Crucifix is hanging on a nail, remove Crucifix as well as the nail Yes       *If any question above is answered "No," documentation is required.        Jacqulynn Cadet, RN  08/21/21 [phone]

## 2021-08-21 NOTE — ED Notes (Signed)
I have reviewed discharge instructions with the patient.  The patient verbalized understanding.    Patient left ED via Discharge Method: ambulatory to [facility] with cab.    Opportunity for questions and clarification provided.       Patient given 1 scripts.         To continue your aftercare when you leave the hospital, you may receive an automated call from our care team to check in on how you are doing.  This is a free service and part of our promise to provide the best care and service to meet your aftercare needs." If you have questions, or wish to unsubscribe from this service please call [phone].  Thank you for Choosing our [facility] Emergency Department.        Loma Newton, RN  08/21/21 1143

## 2021-08-21 NOTE — ED Notes (Signed)
Patient report and care transferred to [facility], RN at this time.     Carlos Burton, [place]  08/21/21 [phone]

## 2021-08-21 NOTE — ED Notes (Signed)
Patient woke up to take medication. Respirations even and unlabored. No needs stated.     Jenne Campus, RN  08/21/21 [phone]

## 2021-08-21 NOTE — ED Notes (Signed)
Pt resting comfortably in bed with unlabored respirations      Beatrix Fetters, RN  08/21/21 [phone]

## 2021-08-21 NOTE — Care Coordination-Inpatient (Signed)
Patient is resting comfortably in room on cart.  He is originally from [facility] [place] and came here for a Sober living treatment program which he has been let go from.  He has since been homeless and reports increasing depression.  Medications have been changed based on the telepsych recommendations.  Patient has been referred to inpatient at his request however he is NOT on commitment papers.     Papers reports receiving government funded [employer] that starts today.    08/21/21 0844   Service Assessment   Patient Orientation Alert and Oriented   Cognition Alert   History Provided By Patient   Primary Caregiver Self   Support Systems None   Patient's Healthcare Decision Maker is: Legal Next of Kin   PCP Verified by CM No   Prior Functional Level Independent in ADLs/IADLs   Current Functional Level Independent in ADLs/IADLs   Can patient return to prior living arrangement No   Ability to make needs known: Fair   Family able to assist with home care needs: No   Would you like for me to discuss the discharge plan with any other family members/significant others, and if so, who? No   Financial Resources [employer] None   Social/Functional History   Lives With Alone   Type of Home Homeless

## 2021-08-21 NOTE — ED Notes (Signed)
Took over care of patient at this time. Patient resting comfortably in bed. Respirations even and unlabored. No needs stated.     Jenne Campus, RN  08/21/21 [phone]

## 2021-08-21 NOTE — ED Notes (Signed)
Patient resting quietly with eyes closed. RR even and unlabored.  No needs voiced at this time.     Aileen Fass, RN  08/21/21 1005

## 2021-08-21 NOTE — ED Notes (Signed)
Pt resting comfortably in bed with unlabored respirations      Beatrix Fetters, RN  08/21/21 [phone]

## 2021-08-21 NOTE — ED Notes (Signed)
Patient eating breakfast. No needs voiced at this time.     Aileen Fass, RN  08/21/21 [phone]

## 2021-08-21 NOTE — ED Notes (Signed)
Pt resting comfortably in bed with unlabored respirations      Beatrix Fetters, RN  08/21/21 [phone]

## 2021-08-21 NOTE — Discharge Instructions (Signed)
[place] National Suicide Hotline :  1-800 SUICIDE  1800-784-2433    1-800-273-TALK  1800-273-8255   [facility]  [phone]   [facility] [phone]   [facility] [place] [phone]   [facility] [place] [phone]   [facility] [place] [phone]   [facility] [phone]   [facility] [phone]   [facility] [phone]   Medicaid Office [phone]   [facility] [phone]   [facility] [phone]   [facility] [phone]   [facility] [phone]   [facility] [phone]   [place] [facility] [phone]   [facility] [phone]   [facility] [phone]   [facility] [phone]    [facility] [phone]   [facility] [phone]

## 2021-08-21 NOTE — ED Notes (Signed)
Pt resting comfortably in bed with unlabored respirations      Beatrix Fetters, RN  08/21/21 [phone]

## 2021-08-21 NOTE — ED Notes (Signed)
Patient sleeping comfortably in bed. Respirations even and unlabored. No needs stated.      Jenne Campus, RN  08/21/21 [phone]

## 2021-08-21 NOTE — Case Communication (Addendum)
Patient is unclear about his recent living environments.  He states home is [place] and that he was homeless there.  He arrived in [place] from [place] and seems to have been in the area for some time after he reports difficulty getting a bed on a number of occasions at the [employer].  He tried to go to [facility] but was declined due to lack of identification but could try to go back here for help if he were to obtained an ID.  Patient states he has lost his ID.  Patient reports he has not used for a month - UDS is negative.     Voluntary placement referrals were made and follow up calls were made.  All have declined.    Patient is referred to [facility] for follow up services.  His phone number is confirmed and patient states he can be reached there.    Patient is given shelter resources, soup kitchen schedule and will be [facility] get assistance to work on getting an ID.

## 2021-08-24 ENCOUNTER — Inpatient Hospital Stay
Admit: 2021-08-24 | Discharge: 2021-08-24 | Disposition: A | Discharge status: Home / Self Care | Payer: BLUE CROSS/BLUE SHIELD | Attending: Emergency Medicine

## 2021-08-24 ENCOUNTER — Emergency Department: Admit: 2021-08-24 | Payer: BLUE CROSS/BLUE SHIELD

## 2021-08-24 DIAGNOSIS — M79644 Pain in right finger(s): Secondary | ICD-10-CM

## 2021-08-24 MED ORDER — IBUPROFEN 600 MG PO TABS
600 MG | ORAL | Status: AC
Start: 2021-08-24 — End: 2021-08-24
  Administered 2021-08-24: 09:00:00 600 mg via ORAL

## 2021-08-24 MED FILL — IBUPROFEN 600 MG PO TABS: 600 MG | ORAL | Qty: 1

## 2021-08-24 NOTE — Discharge Instructions (Signed)
Use tylenol or ibuprofen as needed for pain. Return for any concerns.

## 2021-08-24 NOTE — ED Triage Notes (Signed)
Pt states that he jammed his right thumb in a piece of equipment at work, c/o pain with movement and states that it feels like its swollen; +ROM noted, denies numbness/tingling

## 2021-08-24 NOTE — ED Provider Notes (Signed)
Emergency Department Provider Note       PCP: No primary care provider on file.   Age: 56 y.o.   Sex: male     DISPOSITION Decision To Discharge 08/24/2021 03:50:50 AM       ICD-10-CM    1. Pain of right thumb  M79.644           Medical Decision Making     Complexity of Problems Addressed:  1 uncomplicated illness or injury.    Data Reviewed and Analyzed:  Category 1:   I independently ordered and reviewed each unique test.         Category 2:   I interpreted the X-rays x-ray of right hand unremarkable.    Category 3: Discussion of management or test interpretation.  56 year old male presents with right thumb pain after jamming his thumb at work.  X-ray unremarkable.      Risk of Complications and/or Morbidity of Patient Management:  Prescription drug management performed.  Patient was discharged risks and benefits of hospitalization were considered.  Shared medical decision making was utilized in creating the patients health plan today.    ED Course as of 08/24/21 0510   Thu Aug 24, 2021   0350 XR right hand:     IMPRESSION:     1.  No acute osseous abnormality or radiopaque foreign body.     [CJ]      ED Course User Index  [CJ] Haynes Dage, APRN - CNP       History      Carlos Burton is a 56 y.o. male who presents to the Emergency Department with chief complaint of    Chief Complaint   Patient presents with    Finger Pain      56 year old male with history of hypertension presents for right thumb pain. Reports that he jammed him thumb while working at [employer] earlier [employer].     The history is provided by the patient.      Review of Systems   Musculoskeletal:  Positive for arthralgias and joint swelling.   All other systems reviewed and are negative.    Physical Exam     Vitals signs and nursing note reviewed:  Vitals:    08/24/21 0308   BP: (!) 139/105   Pulse: 60   Resp: 18   Temp: 97.6 F (36.4 C)   TempSrc: Oral   SpO2: 96%      Physical Exam  Vitals and nursing note reviewed.   Constitutional:        Appearance: Normal appearance.   HENT:      Head: Normocephalic and atraumatic.   Musculoskeletal:         General: Swelling and tenderness present. Normal range of motion.        Hands:       Right lower leg: No edema.      Left lower leg: No edema.   Neurological:      General: No focal deficit present.      Mental Status: He is alert.   Psychiatric:         Mood and Affect: Mood normal.         Behavior: Behavior normal.         Thought Content: Thought content normal.         Judgment: Judgment normal.        Procedures     Procedures    Orders Placed This Encounter  Procedures    XR HAND RIGHT (MIN 3 VIEWS)        Medications   ibuprofen (ADVIL;MOTRIN) tablet 600 mg (600 mg Oral Given 08/24/21 0437)       Discharge Medication List as of 08/24/2021  4:35 AM           No past medical history on file.     No past surgical history on file.     Social History     Socioeconomic History    Marital status: Legally Separated        Discharge Medication List as of 08/24/2021  4:35 AM        CONTINUE these medications which have NOT CHANGED    Details   buPROPion (ZYBAN) 150 MG extended release tablet Take 1 tablet by mouth every morning, Disp-30 tablet, R-1Print      ibuprofen (ADVIL;MOTRIN) 600 MG tablet Take 1 tablet by mouth in the morning, at noon, and at bedtime for 7 days Take with food, Disp-21 tablet, R-0Print              Results for orders placed or performed during the hospital encounter of 08/24/21   XR HAND RIGHT (MIN 3 VIEWS)    Narrative    EXAMINATION: XR HAND RIGHT (MIN 3 VIEWS)      DATE OF EXAM: 08/24/2021 3:15 AM    HISTORY: right thumb pain after injury    COMPARISON: None.    FINDINGS:    Right hand three views: No acute osseous abnormality. Old fracture deformity   distal tuft distal phalanx ring finger. Joint spaces preserved. No radiopaque   foreign body.        Impression    1.  No acute osseous abnormality or radiopaque foreign body.      Angela Adam, M.D.   08/24/2021 3:44:00 AM        XR HAND  RIGHT (MIN 3 VIEWS)   Final Result      1.  No acute osseous abnormality or radiopaque foreign body.        Angela Adam, M.D.    08/24/2021 3:44:00 AM                        Voice dictation software was used during the making of this note.  This software is not perfect and grammatical and other typographical errors may be present.  This note has not been completely proofread for errors.     Haynes Dage, APRN - CNP  08/24/21 0510
